# Patient Record
Sex: Female | Born: 1965 | Race: Black or African American | Hispanic: No | Marital: Married | Smoking: Never smoker
Health system: Southern US, Community
[De-identification: ages and names within clinical notes are randomized; demographics above are authoritative.]

## PROBLEM LIST (undated history)

## (undated) DIAGNOSIS — R0602 Shortness of breath: Secondary | ICD-10-CM

## (undated) DIAGNOSIS — I493 Ventricular premature depolarization: Secondary | ICD-10-CM

## (undated) DIAGNOSIS — E538 Deficiency of other specified B group vitamins: Secondary | ICD-10-CM

## (undated) DIAGNOSIS — R1310 Dysphagia, unspecified: Secondary | ICD-10-CM

## (undated) DIAGNOSIS — Z8489 Family history of other specified conditions: Secondary | ICD-10-CM

## (undated) DIAGNOSIS — R002 Palpitations: Secondary | ICD-10-CM

## (undated) DIAGNOSIS — E669 Obesity, unspecified: Secondary | ICD-10-CM

## (undated) DIAGNOSIS — J45909 Unspecified asthma, uncomplicated: Secondary | ICD-10-CM

## (undated) DIAGNOSIS — M7989 Other specified soft tissue disorders: Secondary | ICD-10-CM

## (undated) DIAGNOSIS — E559 Vitamin D deficiency, unspecified: Secondary | ICD-10-CM

## (undated) DIAGNOSIS — R079 Chest pain, unspecified: Secondary | ICD-10-CM

## (undated) DIAGNOSIS — R030 Elevated blood-pressure reading, without diagnosis of hypertension: Secondary | ICD-10-CM

## (undated) DIAGNOSIS — M2550 Pain in unspecified joint: Secondary | ICD-10-CM

## (undated) DIAGNOSIS — R7303 Prediabetes: Secondary | ICD-10-CM

## (undated) HISTORY — DX: Elevated blood-pressure reading, without diagnosis of hypertension: R03.0

## (undated) HISTORY — PX: LAPAROSCOPY: SHX197

## (undated) HISTORY — DX: Dysphagia, unspecified: R13.10

## (undated) HISTORY — PX: TUBAL LIGATION: SHX77

## (undated) HISTORY — DX: Ventricular premature depolarization: I49.3

## (undated) HISTORY — DX: Obesity, unspecified: E66.9

## (undated) HISTORY — DX: Shortness of breath: R06.02

## (undated) HISTORY — DX: Vitamin D deficiency, unspecified: E55.9

## (undated) HISTORY — DX: Unspecified asthma, uncomplicated: J45.909

## (undated) HISTORY — DX: Other specified soft tissue disorders: M79.89

## (undated) HISTORY — DX: Deficiency of other specified B group vitamins: E53.8

## (undated) HISTORY — DX: Chest pain, unspecified: R07.9

## (undated) HISTORY — DX: Palpitations: R00.2

## (undated) HISTORY — DX: Pain in unspecified joint: M25.50

---

## 2013-10-29 ENCOUNTER — Ambulatory Visit: Payer: Self-pay

## 2013-10-29 LAB — URINALYSIS, COMPLETE
BILIRUBIN, UR: NEGATIVE
Bacteria: NEGATIVE
Glucose,UR: NEGATIVE mg/dL (ref 0–75)
Ketone: NEGATIVE
Leukocyte Esterase: NEGATIVE
Nitrite: NEGATIVE
Ph: 6.5 (ref 4.5–8.0)
Specific Gravity: 1.02 (ref 1.003–1.030)

## 2013-10-29 LAB — WET PREP, GENITAL

## 2013-10-29 LAB — PREGNANCY, URINE: Pregnancy Test, Urine: NEGATIVE m[IU]/mL

## 2013-10-29 LAB — GC/CHLAMYDIA PROBE AMP

## 2013-11-09 ENCOUNTER — Ambulatory Visit: Payer: Self-pay

## 2013-11-26 DIAGNOSIS — Z1589 Genetic susceptibility to other disease: Secondary | ICD-10-CM | POA: Insufficient documentation

## 2013-11-26 HISTORY — DX: Genetic susceptibility to other disease: Z15.89

## 2014-01-14 ENCOUNTER — Ambulatory Visit: Payer: Self-pay | Admitting: Family Medicine

## 2014-02-01 DIAGNOSIS — R3129 Other microscopic hematuria: Secondary | ICD-10-CM | POA: Insufficient documentation

## 2014-09-10 ENCOUNTER — Ambulatory Visit: Payer: Self-pay | Admitting: Registered Nurse

## 2014-09-24 ENCOUNTER — Ambulatory Visit: Payer: Self-pay | Admitting: Physician Assistant

## 2015-01-11 ENCOUNTER — Other Ambulatory Visit: Payer: Self-pay | Admitting: Family Medicine

## 2015-01-11 DIAGNOSIS — Z1231 Encounter for screening mammogram for malignant neoplasm of breast: Secondary | ICD-10-CM

## 2015-10-20 ENCOUNTER — Ambulatory Visit: Payer: 59 | Attending: Obstetrics and Gynecology | Admitting: Physical Therapy

## 2015-10-20 DIAGNOSIS — M629 Disorder of muscle, unspecified: Secondary | ICD-10-CM | POA: Insufficient documentation

## 2015-10-20 DIAGNOSIS — M6289 Other specified disorders of muscle: Secondary | ICD-10-CM

## 2015-10-20 DIAGNOSIS — R279 Unspecified lack of coordination: Secondary | ICD-10-CM | POA: Diagnosis present

## 2015-10-20 DIAGNOSIS — N8184 Pelvic muscle wasting: Secondary | ICD-10-CM | POA: Diagnosis not present

## 2015-10-20 NOTE — Therapy (Addendum)
[facility] [address] [place], [place], [address] Phone: [phone]   Fax:  [phone]  Physical Therapy Evaluation  Patient Details  Name: Almyra BraceRamona C Castillo Segoviano MRN: [id number] Date of Birth: 18-Jun-1966 Referring Provider: Tildon HuskyHalfon  Encounter Date: 10/20/2015      PT End of Session - 10/29/15 1840    Visit Number 1   Number of Visits 12   Date for PT Re-Evaluation 01/12/16   PT Start Time 0840   PT Stop Time 1000   PT Time Calculation (min) 80 min   Activity Tolerance Patient tolerated treatment well;No increased pain   Behavior During Therapy [facility] for tasks assessed/performed      No past medical history on file.  No past surgical history on file.  There were no vitals filed for this visit.  Visit Diagnosis:  Pelvic floor dysfunction  Lack of coordination  Fascial defect      Subjective Assessment - 10/29/15 1839    Subjective   1)  decreased perinal sensation for the past 4 years when her menstrual cycle started to change in duration changing every month to every other 2 month. In 2012, pt underwent a falliopian tube procedure. 2)  R LQ  pain: dull intermittent ache, 5-6/10.  Peaks and lasts 5-10 min. No specific pattern with onset. Started the past 1 year. Denied weight loss, bloody stools, night sweats. Does not interfere with ADLs. 3) Pt reports changes in her bowel movements in consistency from Type  5-7 in the past 6 months. Describes stool "ribbonlike".  Pt thinks it may have to do with her change in her eating. Frequency of bowel movements 1-2x daily.    Denied pain in all places in her body.    Pertinent History Hx of 9 vaginal deliveries, 7 live children.  Tearing/ episiotomies. Hx of 3 month bedrest after her 7th pregnancy due to pre-term labor.  For 7 out 9 pregnancies, pt reports she has had her "membrane scraped".              [facility] - 10/29/15 1833    Assessment   Medical Diagnosis pelvic dysfunction   Referring Provider Halfon   Precautions   Precautions None   Restrictions   Weight Bearing Restrictions No   Balance Screen   Has the patient fallen in the past 6 months No   Coordination   Gross Motor Movements are Fluid and Coordinated --  chest breathing   Fine Motor Movements are Fluid and Coordinated --  no pelvic floor movement w/ cue for contraction, adductors   Posture/Postural Control   Posture Comments lumbopelvic instability w/ hip flexion   Bed Mobility   Bed Mobility --  crunch  method                 Pelvic Floor Special Questions - 10/29/15 1835    Pelvic Floor Internal Exam pt consented verbally and had no contraindications   Exam Type Vaginal   Sensation equal bilaterally w/ q tip test   Palpation bladder in more ventral position in hooklying, within introitus with coughing, and in standing     Strength fair squeeze, definite lift  posterior  > ant initially. required pillow and manual Tx   Strength # of reps 10   Strength # of seconds 1                  PT Education - 10/29/15 1839  Education provided Yes   Education Details POC, anatomy, physioogy, goals    Person(s) Educated Patient   Methods Explanation;Demonstration;Tactile cues;Verbal cues;Handout   Comprehension Returned demonstration;Verbalized understanding             PT Long Term Goals - 10/29/15 1845    PT LONG TERM GOAL #1   Title Pt will demo Grade 3/5/5/5 in pelvic floor strength in order to have improved perineal circulation and increased sensation.   Time 12   Period Weeks   Status New   PT LONG TERM GOAL #2   Title Pt will demo more caudal position of bladder without a pillow under hips in order to progress to upright pelvic floor training exercises.   Time 12   Period Weeks   Status New   PT LONG TERM GOAL #3   Title Pt will demo proper body mechanics with lifting and floor to rise in order to minimize further  lowered position of bladder when lifting and playing with her children.   Time 12   Period Weeks   Status New   PT LONG TERM GOAL #4   Title Pt will report Bristol Type 3-5 in order to demonstrate improved her GI and pelvic floor function.    Time 12   Period Weeks   Status New               Plan - 10/29/15 1840    Clinical Impression Statement Pt is a 62 y female who c/o decreased  perineal sensation, R upper abdominal pain, and loose stools which impact her QOL and ADLs. Pt's  clinical presentations include pelvic floor dyscoordination,  abnormal  position of bladder which limited her ability to perform proper pelvic  floor contraction, and poor understanding of posture and body mechanics  to decrease downward forces onto her pelvic floor. External and internal sensory tests were normal, thus, suspect pt's report of decreased perineal sensation is impacted by poor fascial tensions and abnormal position of bladder in supine and standing positions. Pt's personal factors include multple pregnancies with perineal trauma and delivery difficulty  and various gynecological conditions. Plan to assess her R abdominal pain  more in depth at next session. Pt denied nausea, vomiting, blood in stool/urine, sweats, chills, fevers, nor unintentional weight loss.       Pt will benefit from skilled therapeutic intervention in order to improve on the following deficits Decreased activity tolerance;Hypomobility;Decreased strength;Decreased range of motion;Postural dysfunction;Impaired sensation;Pain;Improper body mechanics;Decreased mobility;Decreased coordination;Decreased safety awareness;Decreased endurance;Increased fascial restricitons   Rehab Potential Good   PT Frequency 1x / week   PT Duration 12 weeks   PT Treatment/Interventions ADLs/Self Care Home Management;Aquatic Therapy;Biofeedback;Cryotherapy;Electrical Stimulation;Gait training;Moist Heat;Traction;Stair training;Functional mobility  training;Therapeutic activities;Therapeutic exercise;Balance training;Neuromuscular re-education;Manual techniques;Patient/family education;Scar mobilization;Passive range of motion;Energy conservation;Taping   Consulted and Agree with Plan of Care Patient         Problem List There are no active problems to display for this patient.   Mariane Masters ,PT, DPT, E-RYT  10/29/2015, 6:51 PM  [facility] MAIN [facility] SERVICES [address] [place], [place], [address] Phone: [phone]   Fax:  [phone]  Name: Janika Jedlicka MRN: [id number] Date of Birth: 03-Aug-1965

## 2015-10-20 NOTE — Patient Instructions (Signed)
   You are now ready to begin training the deep core muscles system: diaphragm, transverse abdominis, pelvic floor . These muscles must work together as a team.           The key to these exercises to train the brain to coordinate the timing of these muscles and to have them turn on for long periods of time to hold you upright against gravity (especially important if you are on your feet all day).These muscles are postural muscles and play a role stabilizing your spine and bodyweight. By doing these repetitions slowly and correctly instead of doing crunches, you will achieve a flatter belly without a lower pooch. You are also placing your spine in a more neutral position and breathing properly which in turn, decreases your risk for problems related to your pelvic floor, abdominal, and low back such as pelvic organ prolapse, hernias, diastasis recti (separation of superficial muscles), disk herniations, spinal fractures. These exercises set a solid foundation for you to later progress to resistance/ strength training with therabands and weights and return to other typical fitness exercises with a stronger deeper core.  DO LEVEL 1   With pillow  10 reps  , 3 times a day separately                                                        FUNCTIONAL POSTURES

## 2015-10-27 ENCOUNTER — Ambulatory Visit: Payer: 59 | Admitting: Physical Therapy

## 2015-10-29 NOTE — Addendum Note (Signed)
Addended by: Mariane MastersYEUNG, SHIN-YIING on: 10/29/2015 07:00 PM   Modules accepted: Orders

## 2015-11-03 ENCOUNTER — Ambulatory Visit: Payer: 59 | Attending: Obstetrics and Gynecology | Admitting: Physical Therapy

## 2015-11-03 DIAGNOSIS — R279 Unspecified lack of coordination: Secondary | ICD-10-CM

## 2015-11-03 DIAGNOSIS — N8184 Pelvic muscle wasting: Secondary | ICD-10-CM | POA: Diagnosis not present

## 2015-11-03 DIAGNOSIS — M629 Disorder of muscle, unspecified: Secondary | ICD-10-CM

## 2015-11-03 DIAGNOSIS — M6289 Other specified disorders of muscle: Secondary | ICD-10-CM

## 2015-11-03 NOTE — Patient Instructions (Signed)
PELVIC FLOOR / KEGEL EXERCISES   Pelvic floor/ Kegel exercises are used to strengthen the muscles in the base of your pelvis that are responsible for supporting your pelvic organs and preventing urine/feces leakage. Based on your therapist's recommendations, they can be performed while standing, sitting, or lying down. Imagine pelvic floor area as a diamond with pelvic landmarks: top =pubic bone, bottom tip=tailbone, sides=sitting bones (ischial tuberosities).    Make yourself aware of this muscle group by using these cues while coordinating your breath:  Inhale, feel pelvic floor diamond area lower like hammock towards your feet and ribcage/belly expanding. Pause. Let the exhale naturally and feel your belly sink, abdominal muscles hugging in around you and you may notice the pelvic diamond draws upward towards your head forming a umbrella shape. Give a squeeze during the exhalation like you are stopping the flow of urine. If you are squeezing the buttock muscles, try to give 50% less effort.   Common Errors:  Breath holding: If you are holding your breath, you may be bearing down against your bladder instead of pulling it up. If you belly bulges up while you are squeezing, you are holding your breath. Be sure to breathe gently in and out while exercising. Counting out loud may help you avoid holding your breath.  Accessory muscle use: You should not see or feel other muscle movement when performing pelvic floor exercises. When done properly, no one can tell that you are performing the exercises. Keep the buttocks, belly and inner thighs relaxed.  Overdoing it: Your muscles can fatigue and stop working for you if you over-exercise. You may actually leak more or feel soreness at the lower abdomen or rectum.  YOUR HOME EXERCISE PROGRAM  LONG HOLDS: Position: on back  Inhale and then exhale. Then squeeze the muscle and count aloud for 3 seconds. Rest with three long breaths. (Be sure to let  belly sink in with exhales and not push outward)  Perform 4 repetitions, 3 times/day                       DECREASE DOWNWARD PRESSURE ON  YOUR PELVIC FLOOR, ABDOMINAL, LOW BACK MUSCLES       PRESERVE YOUR PELVIC HEALTH LONG-TERM   ** SQUEEZE pelvic floor BEFORE YOUR SNEEZE, COUGH, LAUGH   ** EXHALE BEFORE YOU RISE AGAINST GRAVITY (lifting, sit to stand, from squat to stand)   ** LOG ROLL OUT OF BED INSTEAD OF CRUNCH/SIT-UP

## 2015-11-03 NOTE — Therapy (Signed)
[facility] [address] [place], [place], [address] Phone: [phone]   Fax:  [phone]  Physical Therapy Treatment  Patient Details  Name: Tara BraceRamona C Castillo Segoviano MRN: [id number] Date of Birth: 01-May-1966 Referring Provider: Tildon HuskyHalfon  Encounter Date: 11/03/2015      PT End of Session - 11/03/15 2341    Visit Number 2   Number of Visits 12   Date for PT Re-Evaluation 01/12/16   PT Start Time 0904   PT Stop Time 1000   PT Time Calculation (min) 56 min   Activity Tolerance Patient tolerated treatment well;No increased pain   Behavior During Therapy [facility] for tasks assessed/performed      No past medical history on file.  No past surgical history on file.  There were no vitals filed for this visit.      Subjective Assessment - 11/03/15 0910    Subjective Pt reported she had changes in her menstrual cycle and noticed cramping below the navel. Pt felt some anxiety with internal pelvic floor assessment from last session.     Pertinent History Hx of 9 vaginal deliveries, 7 children.  Tearing/ episiotomies. Hx of 3 month bedrest after her 7th pregnancy due to pre-term labor.  For 7 out 9 pregnancies, pt reports she has had her "membrane scraped".              [facility] Assessment - 11/03/15 1004    Coordination   Coordination and Movement Description diaphragmatic breathing improved with lower bra and tactile cues to decrease chest breathing                  Pelvic Floor Special Questions - 11/03/15 0959    Pelvic Floor Internal Exam pt consented verbally and had no contraindications   Exam Type Vaginal   Palpation bladder initially ventral position, more caudal with pelvic floor training   Strength fair squeeze, definite lift   Strength # of reps 4   Strength # of seconds 3   Biofeedback proper ROM           [facility] Adult PT Treatment/Exercise - 11/03/15 1002    Self-Care   Self-Care --   education on pelvic floor function, decrease anxiety related to internal assessment. Pt provided consent today verbally and expressed less anxiety following Tx.     Therapeutic Activites    Therapeutic Activities --  education, decrease anxiety related to dysfunctions   Neuro Re-ed    Neuro Re-ed Details  see pt insturctions (pelvic floor contractions)                 PT Education - 11/03/15 2340    Education provided Yes   Education Details HEP, education on pelvic floor anatomy/physiology   Person(s) Educated Patient   Methods Demonstration;Explanation;Tactile cues;Verbal cues;Handout   Comprehension Returned demonstration;Verbalized understanding             PT Long Term Goals - 10/29/15 1845    PT LONG TERM GOAL #1   Title Pt will demo Grade 3/5/5/5 in pelvic floor strength in order to have improved perineal circulation and increased sensation.   Time 12   Period Weeks   Status New   PT LONG TERM GOAL #2   Title Pt will demo more caudal position of bladder without a pillow under hips in order to progress to upright pelvic floor training exercises.   Time 12   Period Weeks   Status New  PT LONG TERM GOAL #3   Title Pt will demo proper body mechanics with lifting and floor to rise in order to minimize further lowered position of bladder when lifting and playing with her children.   Time 12   Period Weeks   Status New   PT LONG TERM GOAL #4   Title Pt will report Bristol Type 3-5 in order to demonstrate improved her GI and pelvic floor function.    Time 12   Period Weeks   Status New               Plan - 11/03/15 2341    Clinical Impression Statement Pt demo'd more normal position of bladder and was able to progress to endurance pelvic floor  training with proper coordination. Pt will continue to benefit skilled PT.   Rehab Potential Good   PT Frequency 1x / week   PT Duration 12 weeks   PT Treatment/Interventions ADLs/Self Care Home  Management;Aquatic Therapy;Biofeedback;Cryotherapy;Electrical Stimulation;Gait training;Moist Heat;Traction;Stair training;Functional mobility training;Therapeutic activities;Therapeutic exercise;Balance training;Neuromuscular re-education;Manual techniques;Patient/family education;Scar mobilization;Passive range of motion;Energy conservation;Taping   Consulted and Agree with Plan of Care Patient      Patient will benefit from skilled therapeutic intervention in order to improve the following deficits and impairments:  Decreased activity tolerance, Hypomobility, Decreased strength, Decreased range of motion, Postural dysfunction, Impaired sensation, Pain, Improper body mechanics, Decreased mobility, Decreased coordination, Decreased safety awareness, Decreased endurance, Increased fascial restricitons  Visit Diagnosis: Pelvic floor dysfunction  Lack of coordination  Fascial defect     Problem List There are no active problems to display for this patient.   Tara Wood ,PT, DPT, E-RYT  11/03/2015, 11:43 PM   [facility] MAIN [facility] SERVICES [address] [place], [place], [address] Phone: [phone]   Fax:  [phone]  Name: Tara Wood MRN: [id number] Date of Birth: 12/17/1965

## 2015-11-14 ENCOUNTER — Ambulatory Visit: Payer: 59 | Admitting: Physical Therapy

## 2015-11-21 ENCOUNTER — Encounter: Payer: 59 | Admitting: Physical Therapy

## 2015-11-24 ENCOUNTER — Ambulatory Visit: Payer: 59 | Admitting: Physical Therapy

## 2015-12-08 ENCOUNTER — Ambulatory Visit: Payer: 59 | Attending: Obstetrics and Gynecology | Admitting: Physical Therapy

## 2015-12-29 ENCOUNTER — Ambulatory Visit: Payer: 59 | Admitting: Physical Therapy

## 2016-01-24 ENCOUNTER — Encounter: Payer: 59 | Admitting: Physical Therapy

## 2016-01-24 ENCOUNTER — Ambulatory Visit: Payer: 59 | Attending: Obstetrics and Gynecology | Admitting: Physical Therapy

## 2016-01-24 DIAGNOSIS — M7989 Other specified soft tissue disorders: Secondary | ICD-10-CM | POA: Diagnosis present

## 2016-01-24 DIAGNOSIS — R279 Unspecified lack of coordination: Secondary | ICD-10-CM | POA: Diagnosis present

## 2016-01-24 NOTE — Therapy (Signed)
[place] MAIN [facility] SERVICES [address] [place], [place], [address] Phone: [phone]   Fax:  [phone]  Physical Therapy Treatment / Progress Note  Patient Details  Name: Tara Wood MRN: [id number] Date of Birth: 10-23-1965 Referring Provider: Newman Nip  Encounter Date: 01/24/2016      PT End of Session - 01/24/16 1019    Visit Number 3   Number of Visits 12   Date for PT Re-Evaluation 04/09/16   PT Start Time 0820   PT Stop Time 0905   PT Time Calculation (min) 45 min   Activity Tolerance Patient tolerated treatment well;No increased pain   Behavior During Therapy [facility] for tasks assessed/performed      No past medical history on file.  No past surgical history on file.  There were no vitals filed for this visit.      Subjective Assessment - 01/24/16 0822    Subjective Pt reported she has been well and able to hold her pelvic floor muscles to the count of 6 and then can move to the count of 7 for 4 reps before fatigue. Pt has achieved 8 sec hold for 2 reps. Pt feels she knows she would be progressing more if she came more often to PT but her schedule has been busy with her kids. Pt has difficulty contracting the pelvic floor muscles while counting . PT inquired whether she was counting aloud but she was counting in her head.     Pertinent History Hx of 9 vaginal deliveries, 7 children.  Tearing/ episiotomies. Hx of 3 month bedrest after her 7th pregnancy due to pre-term labor.  For 7 out 9 pregnancies, pt reports she has had her "membrane scraped".              [facility] PT Assessment - 01/24/16 1018    Observation/Other Assessments   Observations ankles crossed under chair    Coordination   Coordination and Movement Description pelvic floor lengthening achieved with slight contraction in seated position but pt unable to maintain endurance                   Pelvic Floor Special Questions - 01/24/16  0001    Pelvic Floor Internal Exam pt consented verbally and had no contraindications   Exam Type Vaginal   Palpation bladder initially ventral position, more caudal with pelvic floor training   Strength good squeeze, good lift, able to hold agaisnt strong resistance   Strength # of reps 4   Strength # of seconds 6   Biofeedback proper ROM           [facility] Adult PT Treatment/Exercise - 01/24/16 1153    Neuro Re-ed    Neuro Re-ed Details  see pt instructions, education on progression of exercises, counting aloud to avoid breathholding    Manual Therapy   Internal Pelvic Floor faciliation of anterior pelvic floor mm,                 PT Education - 01/24/16 1019    Education provided Yes   Education Details HEP   Person(s) Educated Patient   Methods Explanation;Demonstration;Tactile cues;Verbal cues;Handout   Comprehension Returned demonstration;Verbalized understanding             PT Long Term Goals - 01/24/16 1020    PT LONG TERM GOAL #1   Title Pt will demo Grade 3/5/5/5 in pelvic floor strength in order to have improved perineal circulation and increased  sensation. (6/28: Grade 4/ 6/ 4)    Time 12   Period Weeks   Status Achieved   PT LONG TERM GOAL #2   Title Pt will demo more caudal position of bladder without a pillow under hips in order to progress to upright pelvic floor training exercises.   Time 12   Period Weeks   Status Partially Met   PT LONG TERM GOAL #3   Title Pt will demo proper body mechanics with lifting and floor to rise in order to minimize further lowered position of bladder when lifting and playing with her children.   Time 12   Period Weeks   Status On-going   PT LONG TERM GOAL #4   Title Pt will report Bristol Type 3-5 in order to demonstrate improved her GI and pelvic floor function.    Time 12   Period Weeks   Status On-going               Plan - 01/24/16 1021    Clinical Impression Statement Pt has achieved one goal,  partially met one goal, and progressing towards her remaining two goals across the past 3 visits across the past three months. Pt was not able to come to her weekly appointments due to scheduling conflicts. Pt showed good carry over with pelvic floor training but required cuing to perform proper technique without holding her breath. Pt showed improved strength in her pelvic floor mm after cuing and manual Tx for more circumferential contraction and more cranial position of her bladder.  Pt will continue to benefit skilled PT to continue with pelvic floor training.       Rehab Potential Good   PT Frequency 1x / week   PT Duration 12 weeks   PT Treatment/Interventions ADLs/Self Care Home Management;Aquatic Therapy;Biofeedback;Cryotherapy;Electrical Stimulation;Gait training;Moist Heat;Traction;Stair training;Functional mobility training;Therapeutic activities;Therapeutic exercise;Balance training;Neuromuscular re-education;Manual techniques;Patient/family education;Scar mobilization;Passive range of motion;Energy conservation;Taping   Consulted and Agree with Plan of Care Patient      Patient will benefit from skilled therapeutic intervention in order to improve the following deficits and impairments:  Decreased activity tolerance, Hypomobility, Decreased strength, Decreased range of motion, Postural dysfunction, Impaired sensation, Pain, Improper body mechanics, Decreased mobility, Decreased coordination, Decreased safety awareness, Decreased endurance, Increased fascial restricitons  Visit Diagnosis: Unspecified lack of coordination - Plan: PT plan of care cert/re-cert  Other specified soft tissue disorders - Plan: PT plan of care cert/re-cert     Problem List There are no active problems to display for this patient.   Jerl Mina ,PT, DPT, E-RYT  01/24/2016, 3:36 PM  [place] MAIN [facility] SERVICES [address] [place], [place], [address] Phone:  [phone]   Fax:  [phone]  Name: Tara Wood MRN: [id number] Date of Birth: [birth year]

## 2016-01-24 NOTE — Patient Instructions (Addendum)
Proper sitting , feet under knee  Decrease straining with bowel movements . Use step stool under feet. Don not raise your heels.   PELVIC FLOOR / KEGEL EXERCISES   Pelvic floor/ Kegel exercises are used to strengthen the muscles in the base of your pelvis that are responsible for supporting your pelvic organs and preventing urine/feces leakage. Based on your therapist's recommendations, they can be performed while standing, sitting, or lying down.  Make yourself aware of this muscle group by using these cues:  Imagine you are in a crowded room and you feel the need to pass gas. Your response is to pull up and in at the rectum.  Close the rectum. Pull the muscles up inside your body,feeling your scrotum lifting as well . Feel the pelvic floor muscles lift as if you were walking into a cold lake.  Place your hand on top of your pubic bone. Tighten and draw in the muscles around the anal muscles without squeezing the buttock muscles.  Common Errors:  Breath holding: If you are holding your breath, you may be bearing down against your bladder instead of pulling it up. If you belly bulges up while you are squeezing, you are holding your breath. Be sure to breathe gently in and out while exercising. Counting out loud may help you avoid holding your breath.  Accessory muscle use: You should not see or feel other muscle movement when performing pelvic floor exercises. When done properly, no one can tell that you are performing the exercises. Keep the buttocks, belly and inner thighs relaxed.  Overdoing it: Your muscles can fatigue and stop working for you if you over-exercise. You may actually leak more or feel soreness at the lower abdomen or rectum.  YOUR HOME EXERCISE PROGRAM  LONG HOLDS: Position: on back w/ pillow under hips   Inhale and then exhale. Then squeeze the muscle and count aloud for 6 seconds. Rest with three long breaths. (Be sure to let belly sink in with exhales and not push  outward)  Perform 4 repetitions, 5 times/day  SHORT HOLDS: Position: on back, sitting   Inhale and then exhale. Then squeeze the muscle.  (Be sure to let belly sink in with exhales and not push outward)  Perform 5 repetitions, 5  Times/day  **ALSO SQUEEZE BEFORE YOUR SNEEZE, COUGH, LAUGH to decrease downward pressure   **ALSO EXHALE BEFORE YOU RISE AGAINST GRAVITY (lifting, sit to stand, from squat to stand)      Drinking 1 - 16 fl oz of water to 1 (16 fl oz of decaf coffee) this week  Gradually increase water to bladder irritant to 2 916 fl oz) to 1 (16 fl oz) this week

## 2016-02-15 ENCOUNTER — Ambulatory Visit: Payer: 59 | Attending: Obstetrics and Gynecology | Admitting: Physical Therapy

## 2016-02-15 DIAGNOSIS — M629 Disorder of muscle, unspecified: Secondary | ICD-10-CM

## 2016-02-15 DIAGNOSIS — R279 Unspecified lack of coordination: Secondary | ICD-10-CM

## 2016-02-15 DIAGNOSIS — N8184 Pelvic muscle wasting: Secondary | ICD-10-CM | POA: Diagnosis present

## 2016-02-15 DIAGNOSIS — M7989 Other specified soft tissue disorders: Secondary | ICD-10-CM | POA: Diagnosis present

## 2016-02-15 DIAGNOSIS — M6289 Other specified disorders of muscle: Secondary | ICD-10-CM

## 2016-02-15 NOTE — Patient Instructions (Addendum)
Handout on sweeping and mopping and lifting mechanics   Stand to floor transitions with front lunge and tucked toes (feet hip width apart)  Engage in pelvic floor/ deep core mm on the rise.  Continue with pelvic floor exercises

## 2016-02-27 NOTE — Therapy (Signed)
[place] MAIN [facility] SERVICES [address] [place], [place], [address] Phone: [phone]   Fax:  [phone]  Physical Therapy Treatment  Patient Details  Name: Tara Wood MRN: [id number] Date of Birth: 1966-04-23 Referring Provider: Newman Nip  Encounter Date: 02/15/2016      PT End of Session - 02/16/16 1702    Visit Number 4   Number of Visits 12   Date for PT Re-Evaluation 04/09/16   Activity Tolerance Patient tolerated treatment well;No increased pain   Behavior During Therapy [facility] for tasks assessed/performed      No past medical history on file.  No past surgical history on file.  There were no vitals filed for this visit.      Subjective Assessment - 02/16/16 1702    Subjective Pt reported the pelvic floor exercises are going better with proper technique she learned last time. Pt has not noticed R LQ pain for the past month. Pt has reported changes in stool formation from [facility] Stool Type from 7 to 5-6.  Pt catches herself from straining with bowel movements.     Pertinent History Hx of 9 vaginal deliveries, 7 children.  Tearing/ episiotomies. Hx of 3 month bedrest after her 7th pregnancy due to pre-term labor.  For 7 out 9 pregnancies, pt reports she has had her "membrane scraped".              [facility] PT Assessment - 02/16/16 1702      Functional Tests   Functional tests --  poor mechanics lifting, floor <> stand transitions.                   Pelvic Floor Special Questions - 02/16/16 1702    Pelvic Floor Internal Exam pt consented verbally and had no contraindications   Exam Type Vaginal   Palpation  more caudal with pelvic floor training   Strength good squeeze, good lift, able to hold agaisnt strong resistance   Strength # of reps 4   Strength # of seconds 7   Biofeedback proper ROM           [facility] Adult PT Treatment/Exercise - 02/16/16 1702       Therapeutic Activites    Therapeutic Activities --  see pt instructions     Neuro Re-ed    Neuro Re-ed Details  --                     PT Long Term Goals - 02/16/16 1702      PT LONG TERM GOAL #1   Title Pt will demo Grade 3/5/5/5 in pelvic floor strength in order to have improved perineal circulation and increased sensation. (6/28: Grade 4/ 6/ 4)    Time 12   Period Weeks   Status Achieved     PT LONG TERM GOAL #2   Title Pt will demo more caudal position of bladder without a pillow under hips in order to progress to upright pelvic floor training exercises.   Time 12   Period Weeks   Status Achieved     PT LONG TERM GOAL #3   Title Pt will demo proper body mechanics with lifting and floor to rise in order to minimize further lowered position of bladder when lifting and playing with her children.   Time 12   Period Weeks   Status Achieved     PT LONG TERM GOAL #4   Title Pt will report [place]  Type 3-5 in order to demonstrate improved her GI and pelvic floor function. (7/20: Type 5-6)    Time 12   Period Weeks   Status Partially Met               Plan -02/16/16 1702    Clinical Impression Statement Pt is progressing well with pelvic floor endurance training and progressed to applying deep core with functional positions. Pt showed a more cranial position of her pelvic organ with more complete closure of her pelvic floor mm without a need to elevate her hips on a pillow. Pt is close to achieving her remaining goal before d/c.    Rehab Potential Good   PT Frequency 1x / week   PT Duration 12 weeks   PT Treatment/Interventions ADLs/Self Care Home Management;Aquatic Therapy;Biofeedback;Cryotherapy;Electrical Stimulation;Gait training;Moist Heat;Traction;Stair training;Functional mobility training;Therapeutic activities;Therapeutic exercise;Balance training;Neuromuscular re-education;Manual techniques;Patient/family education;Scar mobilization;Passive range of motion;Energy  conservation;Taping   Consulted and Agree with Plan of Care Patient      Patient will benefit from skilled therapeutic intervention in order to improve the following deficits and impairments:  Decreased activity tolerance, Hypomobility, Decreased strength, Decreased range of motion, Postural dysfunction, Impaired sensation, Pain, Improper body mechanics, Decreased mobility, Decreased coordination, Decreased safety awareness, Decreased endurance, Increased fascial restricitons  Visit Diagnosis: Unspecified lack of coordination  Other specified soft tissue disorders  Pelvic floor dysfunction  Lack of coordination  Fascial defect     Problem List There are no active problems to display for this patient.   Jerl Mina ,PT, DPT, E-RYT  02/27/2016, 5:05 PM  [place] MAIN [facility] SERVICES [address] [place], [place], [address] Phone: [phone]   Fax:  [phone]  Name: Gianah Batt MRN: [id number] Date of Birth: 1965-10-29

## 2016-03-04 ENCOUNTER — Encounter: Payer: Self-pay | Admitting: Physical Therapy

## 2016-03-04 ENCOUNTER — Ambulatory Visit: Payer: 59 | Attending: Obstetrics and Gynecology | Admitting: Physical Therapy

## 2016-03-04 NOTE — Therapy (Signed)
[facility] [address] [place], [place], [address] Phone: [phone]   Fax:  [phone]  Patient Details  Name: Tara Wood MRN: [id number] Date of Birth: [birth year] Referring Provider:  Dr. Tildon HuskyHalfon   Encounter Date: 03/04/2016    Discharge Summary  Across 4 visits, pt has achieved 100% of her goals.  Pt's pelvic floor endurance has improved along with her bowel movements which will help her to minimize the risk for pelvic organ prolapse and other pelvic floor dysfunctions.  She now demonstrates a more cranial position of her pelvic organ with more complete closure of her pelvic floor mm and improved coordination of her deep core mm. Pt reports she has [facility] "[facility]eratley better" [facility] the Global Rate of Change.  Thank you for your referral!        PT Long Term Goals - 02/27/16 1659      PT LONG TERM GOAL #1   Title Pt will demo Grade 3/5/5/5 in pelvic floor strength in order to have improved perineal circulation and increased sensation. (6/28: Grade 4/ 6/ 4)    Time 12   Period Weeks   Status Achieved     PT LONG TERM GOAL #2   Title Pt will demo more caudal position of bladder without a pillow under hips in order to progress to upright pelvic floor training exercises.   Time 12   Period Weeks   Status Achieved     PT LONG TERM GOAL #3   Title Pt will demo proper body mechanics with lifting and floor to rise in order to minimize further lowered position of bladder when lifting and playing with her children.   Time 12   Period Weeks   Status Achieved     PT LONG TERM GOAL #4   Title Pt will report [place] Type 3-5 in order to demonstrate improved her GI and pelvic floor function. (7/20: Type 5-6)    Time 12   Period Weeks   Status Achieved           Mariane MastersYeung,Shin Yiing ,PT, DPT, E-RYT  03/04/2016, 9:59 AM  [place] [facility] [address] [place], [place], [address] Phone: [phone]   Fax:  [phone]

## 2016-07-05 DIAGNOSIS — Z1231 Encounter for screening mammogram for malignant neoplasm of breast: Secondary | ICD-10-CM | POA: Diagnosis not present

## 2016-07-11 DIAGNOSIS — R0600 Dyspnea, unspecified: Secondary | ICD-10-CM | POA: Diagnosis not present

## 2016-07-11 DIAGNOSIS — R42 Dizziness and giddiness: Secondary | ICD-10-CM | POA: Diagnosis not present

## 2016-07-11 DIAGNOSIS — I10 Essential (primary) hypertension: Secondary | ICD-10-CM | POA: Diagnosis not present

## 2016-07-26 DIAGNOSIS — J4520 Mild intermittent asthma, uncomplicated: Secondary | ICD-10-CM | POA: Diagnosis not present

## 2016-07-26 DIAGNOSIS — R3129 Other microscopic hematuria: Secondary | ICD-10-CM | POA: Diagnosis not present

## 2016-07-26 DIAGNOSIS — M545 Low back pain: Secondary | ICD-10-CM | POA: Diagnosis not present

## 2016-07-26 DIAGNOSIS — J309 Allergic rhinitis, unspecified: Secondary | ICD-10-CM | POA: Diagnosis not present

## 2016-09-09 DIAGNOSIS — Z23 Encounter for immunization: Secondary | ICD-10-CM | POA: Diagnosis not present

## 2016-09-09 DIAGNOSIS — J4520 Mild intermittent asthma, uncomplicated: Secondary | ICD-10-CM | POA: Diagnosis not present

## 2016-09-09 DIAGNOSIS — R040 Epistaxis: Secondary | ICD-10-CM | POA: Diagnosis not present

## 2016-09-09 DIAGNOSIS — G5601 Carpal tunnel syndrome, right upper limb: Secondary | ICD-10-CM | POA: Diagnosis not present

## 2016-10-18 DIAGNOSIS — J301 Allergic rhinitis due to pollen: Secondary | ICD-10-CM | POA: Diagnosis not present

## 2016-11-18 DIAGNOSIS — G5603 Carpal tunnel syndrome, bilateral upper limbs: Secondary | ICD-10-CM | POA: Diagnosis not present

## 2016-11-18 DIAGNOSIS — R200 Anesthesia of skin: Secondary | ICD-10-CM | POA: Diagnosis not present

## 2016-12-12 DIAGNOSIS — M162 Bilateral osteoarthritis resulting from hip dysplasia: Secondary | ICD-10-CM | POA: Diagnosis not present

## 2016-12-12 DIAGNOSIS — M25552 Pain in left hip: Secondary | ICD-10-CM | POA: Diagnosis not present

## 2016-12-12 DIAGNOSIS — M25551 Pain in right hip: Secondary | ICD-10-CM | POA: Diagnosis not present

## 2016-12-12 DIAGNOSIS — G5603 Carpal tunnel syndrome, bilateral upper limbs: Secondary | ICD-10-CM | POA: Diagnosis not present

## 2016-12-12 DIAGNOSIS — R209 Unspecified disturbances of skin sensation: Secondary | ICD-10-CM | POA: Diagnosis not present

## 2016-12-18 DIAGNOSIS — G5603 Carpal tunnel syndrome, bilateral upper limbs: Secondary | ICD-10-CM | POA: Diagnosis not present

## 2016-12-26 DIAGNOSIS — J4520 Mild intermittent asthma, uncomplicated: Secondary | ICD-10-CM | POA: Insufficient documentation

## 2016-12-26 DIAGNOSIS — J069 Acute upper respiratory infection, unspecified: Secondary | ICD-10-CM | POA: Diagnosis not present

## 2016-12-26 DIAGNOSIS — J309 Allergic rhinitis, unspecified: Secondary | ICD-10-CM | POA: Diagnosis not present

## 2016-12-30 DIAGNOSIS — R3129 Other microscopic hematuria: Secondary | ICD-10-CM | POA: Diagnosis not present

## 2016-12-31 DIAGNOSIS — M25552 Pain in left hip: Secondary | ICD-10-CM | POA: Diagnosis not present

## 2016-12-31 DIAGNOSIS — R2689 Other abnormalities of gait and mobility: Secondary | ICD-10-CM | POA: Diagnosis not present

## 2016-12-31 DIAGNOSIS — M25551 Pain in right hip: Secondary | ICD-10-CM | POA: Diagnosis not present

## 2016-12-31 DIAGNOSIS — M79674 Pain in right toe(s): Secondary | ICD-10-CM | POA: Diagnosis not present

## 2016-12-31 DIAGNOSIS — M6281 Muscle weakness (generalized): Secondary | ICD-10-CM | POA: Diagnosis not present

## 2017-01-10 DIAGNOSIS — B349 Viral infection, unspecified: Secondary | ICD-10-CM | POA: Diagnosis not present

## 2017-01-10 DIAGNOSIS — R0789 Other chest pain: Secondary | ICD-10-CM | POA: Diagnosis not present

## 2017-01-10 DIAGNOSIS — R51 Headache: Secondary | ICD-10-CM | POA: Diagnosis not present

## 2017-01-10 DIAGNOSIS — Z79899 Other long term (current) drug therapy: Secondary | ICD-10-CM | POA: Diagnosis not present

## 2017-01-10 DIAGNOSIS — R42 Dizziness and giddiness: Secondary | ICD-10-CM | POA: Diagnosis not present

## 2017-01-10 DIAGNOSIS — R5383 Other fatigue: Secondary | ICD-10-CM | POA: Diagnosis not present

## 2017-01-10 DIAGNOSIS — I10 Essential (primary) hypertension: Secondary | ICD-10-CM | POA: Diagnosis not present

## 2017-01-10 DIAGNOSIS — I493 Ventricular premature depolarization: Secondary | ICD-10-CM | POA: Diagnosis not present

## 2017-01-10 DIAGNOSIS — R001 Bradycardia, unspecified: Secondary | ICD-10-CM | POA: Diagnosis not present

## 2017-01-10 DIAGNOSIS — R0602 Shortness of breath: Secondary | ICD-10-CM | POA: Diagnosis not present

## 2017-01-10 DIAGNOSIS — J45909 Unspecified asthma, uncomplicated: Secondary | ICD-10-CM | POA: Diagnosis not present

## 2017-01-10 DIAGNOSIS — R079 Chest pain, unspecified: Secondary | ICD-10-CM | POA: Diagnosis not present

## 2017-01-10 DIAGNOSIS — R008 Other abnormalities of heart beat: Secondary | ICD-10-CM | POA: Diagnosis not present

## 2017-01-11 DIAGNOSIS — R079 Chest pain, unspecified: Secondary | ICD-10-CM | POA: Diagnosis not present

## 2017-01-11 DIAGNOSIS — I493 Ventricular premature depolarization: Secondary | ICD-10-CM | POA: Diagnosis not present

## 2017-01-11 DIAGNOSIS — R9431 Abnormal electrocardiogram [ECG] [EKG]: Secondary | ICD-10-CM | POA: Diagnosis not present

## 2017-01-16 DIAGNOSIS — J309 Allergic rhinitis, unspecified: Secondary | ICD-10-CM | POA: Diagnosis not present

## 2017-01-16 DIAGNOSIS — R42 Dizziness and giddiness: Secondary | ICD-10-CM | POA: Diagnosis not present

## 2017-01-16 DIAGNOSIS — I10 Essential (primary) hypertension: Secondary | ICD-10-CM | POA: Diagnosis not present

## 2017-01-16 DIAGNOSIS — R008 Other abnormalities of heart beat: Secondary | ICD-10-CM | POA: Diagnosis not present

## 2017-01-21 DIAGNOSIS — M25551 Pain in right hip: Secondary | ICD-10-CM | POA: Diagnosis not present

## 2017-01-21 DIAGNOSIS — M6281 Muscle weakness (generalized): Secondary | ICD-10-CM | POA: Diagnosis not present

## 2017-01-21 DIAGNOSIS — M79674 Pain in right toe(s): Secondary | ICD-10-CM | POA: Diagnosis not present

## 2017-01-21 DIAGNOSIS — M25552 Pain in left hip: Secondary | ICD-10-CM | POA: Diagnosis not present

## 2017-01-21 DIAGNOSIS — R2689 Other abnormalities of gait and mobility: Secondary | ICD-10-CM | POA: Diagnosis not present

## 2017-02-05 DIAGNOSIS — I493 Ventricular premature depolarization: Secondary | ICD-10-CM | POA: Diagnosis not present

## 2017-02-05 DIAGNOSIS — I10 Essential (primary) hypertension: Secondary | ICD-10-CM | POA: Insufficient documentation

## 2017-02-10 DIAGNOSIS — I493 Ventricular premature depolarization: Secondary | ICD-10-CM | POA: Diagnosis not present

## 2017-02-12 DIAGNOSIS — R079 Chest pain, unspecified: Secondary | ICD-10-CM | POA: Diagnosis not present

## 2017-02-12 DIAGNOSIS — I493 Ventricular premature depolarization: Secondary | ICD-10-CM | POA: Diagnosis not present

## 2017-02-26 DIAGNOSIS — I493 Ventricular premature depolarization: Secondary | ICD-10-CM | POA: Diagnosis not present

## 2017-02-26 DIAGNOSIS — I10 Essential (primary) hypertension: Secondary | ICD-10-CM | POA: Diagnosis not present

## 2017-02-27 DIAGNOSIS — J4520 Mild intermittent asthma, uncomplicated: Secondary | ICD-10-CM | POA: Diagnosis not present

## 2017-02-27 DIAGNOSIS — Z23 Encounter for immunization: Secondary | ICD-10-CM | POA: Diagnosis not present

## 2017-02-27 DIAGNOSIS — I493 Ventricular premature depolarization: Secondary | ICD-10-CM | POA: Diagnosis not present

## 2017-02-27 DIAGNOSIS — Z1211 Encounter for screening for malignant neoplasm of colon: Secondary | ICD-10-CM | POA: Diagnosis not present

## 2017-02-27 DIAGNOSIS — R42 Dizziness and giddiness: Secondary | ICD-10-CM | POA: Diagnosis not present

## 2017-07-17 DIAGNOSIS — R350 Frequency of micturition: Secondary | ICD-10-CM | POA: Diagnosis not present

## 2017-07-17 DIAGNOSIS — J3089 Other allergic rhinitis: Secondary | ICD-10-CM | POA: Diagnosis not present

## 2017-07-17 DIAGNOSIS — I10 Essential (primary) hypertension: Secondary | ICD-10-CM | POA: Diagnosis not present

## 2017-07-17 DIAGNOSIS — Z23 Encounter for immunization: Secondary | ICD-10-CM | POA: Diagnosis not present

## 2017-07-17 DIAGNOSIS — L309 Dermatitis, unspecified: Secondary | ICD-10-CM | POA: Diagnosis not present

## 2017-07-17 DIAGNOSIS — J4520 Mild intermittent asthma, uncomplicated: Secondary | ICD-10-CM | POA: Diagnosis not present

## 2017-07-30 DIAGNOSIS — Z1211 Encounter for screening for malignant neoplasm of colon: Secondary | ICD-10-CM | POA: Diagnosis not present

## 2017-09-04 DIAGNOSIS — J309 Allergic rhinitis, unspecified: Secondary | ICD-10-CM | POA: Diagnosis not present

## 2017-09-04 DIAGNOSIS — R300 Dysuria: Secondary | ICD-10-CM | POA: Diagnosis not present

## 2017-09-04 DIAGNOSIS — Z0000 Encounter for general adult medical examination without abnormal findings: Secondary | ICD-10-CM | POA: Diagnosis not present

## 2017-09-04 DIAGNOSIS — J4520 Mild intermittent asthma, uncomplicated: Secondary | ICD-10-CM | POA: Diagnosis not present

## 2017-09-05 DIAGNOSIS — I10 Essential (primary) hypertension: Secondary | ICD-10-CM | POA: Diagnosis not present

## 2017-09-05 DIAGNOSIS — I493 Ventricular premature depolarization: Secondary | ICD-10-CM | POA: Diagnosis not present

## 2017-11-14 DIAGNOSIS — N898 Other specified noninflammatory disorders of vagina: Secondary | ICD-10-CM | POA: Diagnosis not present

## 2017-11-14 DIAGNOSIS — R399 Unspecified symptoms and signs involving the genitourinary system: Secondary | ICD-10-CM | POA: Diagnosis not present

## 2017-11-14 DIAGNOSIS — M79671 Pain in right foot: Secondary | ICD-10-CM | POA: Diagnosis not present

## 2017-11-17 DIAGNOSIS — M79604 Pain in right leg: Secondary | ICD-10-CM | POA: Diagnosis not present

## 2017-11-17 DIAGNOSIS — M79671 Pain in right foot: Secondary | ICD-10-CM | POA: Diagnosis not present

## 2017-11-17 DIAGNOSIS — M7989 Other specified soft tissue disorders: Secondary | ICD-10-CM | POA: Diagnosis not present

## 2017-11-21 DIAGNOSIS — M7989 Other specified soft tissue disorders: Secondary | ICD-10-CM | POA: Diagnosis not present

## 2017-11-21 DIAGNOSIS — M79671 Pain in right foot: Secondary | ICD-10-CM | POA: Diagnosis not present

## 2017-11-27 DIAGNOSIS — Z1231 Encounter for screening mammogram for malignant neoplasm of breast: Secondary | ICD-10-CM | POA: Diagnosis not present

## 2017-11-27 DIAGNOSIS — M7989 Other specified soft tissue disorders: Secondary | ICD-10-CM | POA: Diagnosis not present

## 2017-11-27 DIAGNOSIS — M79671 Pain in right foot: Secondary | ICD-10-CM | POA: Diagnosis not present

## 2017-11-27 DIAGNOSIS — M79604 Pain in right leg: Secondary | ICD-10-CM | POA: Diagnosis not present

## 2017-11-28 DIAGNOSIS — R600 Localized edema: Secondary | ICD-10-CM | POA: Diagnosis not present

## 2017-12-09 DIAGNOSIS — M79671 Pain in right foot: Secondary | ICD-10-CM | POA: Diagnosis not present

## 2017-12-09 DIAGNOSIS — M7989 Other specified soft tissue disorders: Secondary | ICD-10-CM | POA: Diagnosis not present

## 2017-12-11 DIAGNOSIS — M79671 Pain in right foot: Secondary | ICD-10-CM | POA: Insufficient documentation

## 2017-12-15 DIAGNOSIS — M79671 Pain in right foot: Secondary | ICD-10-CM | POA: Diagnosis not present

## 2017-12-29 DIAGNOSIS — M79671 Pain in right foot: Secondary | ICD-10-CM | POA: Diagnosis not present

## 2017-12-29 DIAGNOSIS — R600 Localized edema: Secondary | ICD-10-CM | POA: Diagnosis not present

## 2017-12-30 ENCOUNTER — Other Ambulatory Visit: Payer: Self-pay | Admitting: Podiatry

## 2017-12-30 DIAGNOSIS — R600 Localized edema: Secondary | ICD-10-CM

## 2018-01-09 ENCOUNTER — Encounter (INDEPENDENT_AMBULATORY_CARE_PROVIDER_SITE_OTHER): Payer: Self-pay

## 2018-01-09 ENCOUNTER — Ambulatory Visit
Admission: RE | Admit: 2018-01-09 | Discharge: 2018-01-09 | Disposition: A | Discharge status: Home / Self Care | Payer: 59 | Source: Ambulatory Visit | Attending: Podiatry | Admitting: Podiatry

## 2018-01-09 DIAGNOSIS — M65871 Other synovitis and tenosynovitis, right ankle and foot: Secondary | ICD-10-CM | POA: Diagnosis not present

## 2018-01-09 DIAGNOSIS — R600 Localized edema: Secondary | ICD-10-CM

## 2018-01-30 DIAGNOSIS — M79671 Pain in right foot: Secondary | ICD-10-CM | POA: Diagnosis not present

## 2018-01-30 DIAGNOSIS — R768 Other specified abnormal immunological findings in serum: Secondary | ICD-10-CM | POA: Diagnosis not present

## 2018-02-02 DIAGNOSIS — R202 Paresthesia of skin: Secondary | ICD-10-CM | POA: Diagnosis not present

## 2018-02-16 DIAGNOSIS — R8290 Unspecified abnormal findings in urine: Secondary | ICD-10-CM | POA: Diagnosis not present

## 2018-02-16 DIAGNOSIS — R319 Hematuria, unspecified: Secondary | ICD-10-CM | POA: Diagnosis not present

## 2018-03-24 DIAGNOSIS — G44209 Tension-type headache, unspecified, not intractable: Secondary | ICD-10-CM | POA: Diagnosis not present

## 2018-03-24 DIAGNOSIS — R300 Dysuria: Secondary | ICD-10-CM | POA: Diagnosis not present

## 2018-03-24 DIAGNOSIS — M545 Low back pain: Secondary | ICD-10-CM | POA: Diagnosis not present

## 2018-05-22 DIAGNOSIS — M546 Pain in thoracic spine: Secondary | ICD-10-CM | POA: Diagnosis not present

## 2018-05-22 DIAGNOSIS — M62838 Other muscle spasm: Secondary | ICD-10-CM | POA: Diagnosis not present

## 2018-07-03 DIAGNOSIS — N644 Mastodynia: Secondary | ICD-10-CM | POA: Diagnosis not present

## 2018-07-15 DIAGNOSIS — R202 Paresthesia of skin: Secondary | ICD-10-CM | POA: Diagnosis not present

## 2018-08-05 ENCOUNTER — Other Ambulatory Visit: Payer: Self-pay | Admitting: Medical Oncology

## 2018-08-05 DIAGNOSIS — N644 Mastodynia: Secondary | ICD-10-CM

## 2018-08-12 DIAGNOSIS — R29898 Other symptoms and signs involving the musculoskeletal system: Secondary | ICD-10-CM | POA: Diagnosis not present

## 2018-08-19 ENCOUNTER — Ambulatory Visit
Admission: RE | Admit: 2018-08-19 | Discharge: 2018-08-19 | Disposition: A | Discharge status: Home / Self Care | Payer: 59 | Source: Ambulatory Visit | Attending: Medical Oncology | Admitting: Medical Oncology

## 2018-08-19 DIAGNOSIS — N644 Mastodynia: Secondary | ICD-10-CM | POA: Insufficient documentation

## 2018-08-19 DIAGNOSIS — R928 Other abnormal and inconclusive findings on diagnostic imaging of breast: Secondary | ICD-10-CM | POA: Diagnosis not present

## 2018-09-16 DIAGNOSIS — Z23 Encounter for immunization: Secondary | ICD-10-CM | POA: Diagnosis not present

## 2018-09-16 DIAGNOSIS — M67431 Ganglion, right wrist: Secondary | ICD-10-CM | POA: Diagnosis not present

## 2018-09-16 DIAGNOSIS — J4531 Mild persistent asthma with (acute) exacerbation: Secondary | ICD-10-CM | POA: Diagnosis not present

## 2018-09-16 DIAGNOSIS — I10 Essential (primary) hypertension: Secondary | ICD-10-CM | POA: Diagnosis not present

## 2018-12-15 DIAGNOSIS — M25562 Pain in left knee: Secondary | ICD-10-CM | POA: Diagnosis not present

## 2018-12-15 DIAGNOSIS — R300 Dysuria: Secondary | ICD-10-CM | POA: Diagnosis not present

## 2018-12-24 DIAGNOSIS — R5383 Other fatigue: Secondary | ICD-10-CM | POA: Diagnosis not present

## 2018-12-24 DIAGNOSIS — R52 Pain, unspecified: Secondary | ICD-10-CM | POA: Diagnosis not present

## 2019-01-11 DIAGNOSIS — M79671 Pain in right foot: Secondary | ICD-10-CM | POA: Diagnosis not present

## 2019-01-11 DIAGNOSIS — R768 Other specified abnormal immunological findings in serum: Secondary | ICD-10-CM | POA: Diagnosis not present

## 2019-01-11 DIAGNOSIS — E669 Obesity, unspecified: Secondary | ICD-10-CM | POA: Diagnosis not present

## 2019-01-11 DIAGNOSIS — M2550 Pain in unspecified joint: Secondary | ICD-10-CM | POA: Insufficient documentation

## 2019-02-18 DIAGNOSIS — N898 Other specified noninflammatory disorders of vagina: Secondary | ICD-10-CM | POA: Diagnosis not present

## 2019-02-18 DIAGNOSIS — R8290 Unspecified abnormal findings in urine: Secondary | ICD-10-CM | POA: Diagnosis not present

## 2019-02-18 DIAGNOSIS — M79604 Pain in right leg: Secondary | ICD-10-CM | POA: Diagnosis not present

## 2019-02-18 DIAGNOSIS — R300 Dysuria: Secondary | ICD-10-CM | POA: Diagnosis not present

## 2019-02-18 DIAGNOSIS — R52 Pain, unspecified: Secondary | ICD-10-CM | POA: Diagnosis not present

## 2019-02-19 ENCOUNTER — Other Ambulatory Visit: Payer: Self-pay

## 2019-02-19 DIAGNOSIS — R6889 Other general symptoms and signs: Secondary | ICD-10-CM | POA: Diagnosis not present

## 2019-02-19 DIAGNOSIS — Z20822 Contact with and (suspected) exposure to covid-19: Secondary | ICD-10-CM

## 2019-02-22 ENCOUNTER — Other Ambulatory Visit: Payer: Self-pay

## 2019-02-22 ENCOUNTER — Ambulatory Visit
Admission: EM | Admit: 2019-02-22 | Discharge: 2019-02-22 | Disposition: A | Discharge status: Home / Self Care | Payer: 59 | Attending: Emergency Medicine | Admitting: Emergency Medicine

## 2019-02-22 DIAGNOSIS — U071 COVID-19: Secondary | ICD-10-CM

## 2019-02-22 DIAGNOSIS — J40 Bronchitis, not specified as acute or chronic: Secondary | ICD-10-CM | POA: Diagnosis not present

## 2019-02-22 LAB — NOVEL CORONAVIRUS, NAA: SARS-CoV-2, NAA: DETECTED — AB

## 2019-02-22 MED ORDER — PREDNISONE 50 MG PO TABS: 50.0000 mg | ORAL_TABLET | Freq: Every day | ORAL | 0 refills | Status: DC

## 2019-02-22 MED ORDER — AZITHROMYCIN 250 MG PO TABS: ORAL_TABLET | ORAL | 0 refills | Status: DC

## 2019-02-22 NOTE — ED Provider Notes (Signed)
[facility]    CSN: [id number] Arrival date & time: 02/22/19  1658      History   Chief Complaint Chief Complaint  Patient presents with  . COVID Positive  . Shortness of Breath    HPI Tara Wood is a 53 y.o. female presenting with increased shortness of breath and right sided chest pain. Pt is COVID-19 positive and has been isolating at home since diagnosis. Pt states she has pain to her right posterior chest when she take a deep breaths. No fevers in 48 hrs.   History reviewed. No pertinent past medical history.  There are no active problems to display for this patient.   Past Surgical History:  Procedure Laterality Date  . TUBAL LIGATION      OB History   No obstetric history on file.      Home Medications    Prior to Admission medications   Medication Sig Start Date End Date Taking? Authorizing Provider  ergocalciferol (VITAMIN D2) 50000 units capsule Take 50,000 Units by mouth once a week.   Yes [provider]  levalbuterol Pauline Aus(XOPENEX HFA) 45 MCG/ACT inhaler Inhale into the lungs.   Yes [provider]  loratadine (CLARITIN) 10 MG tablet Take by mouth.   Yes [provider]  spironolactone (ALDACTONE) 25 MG tablet  01/15/19  Yes [provider]  amoxicillin-clavulanate (AUGMENTIN) 875-125 MG tablet Take 1 tablet by mouth 2 (two) times daily.    [provider]  azithromycin (ZITHROMAX Z-PAK) 250 MG tablet Take as directed on package. 02/22/19   Bailey MechBenjamin, Janissa Bertram, NP  hydrochlorothiazide (MICROZIDE) 12.5 MG capsule Take 12.5 mg by mouth daily.    [provider]  levocetirizine (XYZAL) 5 MG tablet Take 5 mg by mouth every evening.    [provider]  losartan (COZAAR) 100 MG tablet Take 100 mg by mouth daily. Reported on 10/20/2015    [provider]  losartan (COZAAR) 25 MG tablet Take 25 mg by mouth daily.    [provider]  mometasone (ELOCON) 0.1 % cream  Apply 1 application topically daily.    [provider]  mometasone (NASONEX) 50 MCG/ACT nasal spray Place 2 sprays into the nose daily.    [provider]  montelukast (SINGULAIR) 10 MG tablet Take 10 mg by mouth at bedtime.    [provider]  potassium chloride (K-DUR,KLOR-CON) 10 MEQ tablet Take 10 mEq by mouth 2 (two) times daily.    [provider]  predniSONE (DELTASONE) 50 MG tablet Take 1 tablet (50 mg total) by mouth daily with breakfast. 02/22/19   Bailey MechBenjamin, Angelli Baruch, NP    Family History Family History  Problem Relation Age of Onset  . Heart failure Mother   . Parkinson's disease Mother   . Stroke Father     Social History Social History   Tobacco Use  . Smoking status: Never Smoker  . Smokeless tobacco: Never Used  Substance Use Topics  . Alcohol use: Not Currently  . Drug use: Not Currently     Allergies   Patient has no known allergies.   Review of Systems Review of Systems  Constitutional: Positive for fatigue. Negative for appetite change, chills and fever.  Respiratory: Positive for cough, chest tightness and shortness of breath.   Gastrointestinal: Negative for nausea and vomiting.     Physical Exam Triage Vital Signs ED Triage Vitals  Enc Vitals Group     BP 02/22/19 1725 (!) 139/93  Pulse Rate 02/22/19 1725 90     Resp 02/22/19 1725 18     Temp 02/22/19 1725 98.6 F (37 C)     Temp Source 02/22/19 1725 Oral     SpO2 02/22/19 1725 99 %     Weight 02/22/19 1720 230 lb (104.3 kg)     Height 02/22/19 1720 5\' 7"  (1.702 m)     Head Circumference --      Peak Flow --      Pain Score 02/22/19 1719 4     Pain Loc --      Pain Edu? --      Excl. in GC? --    No data found.  Updated Vital Signs BP (!) 139/93 (BP Location: Left Arm)   Pulse 90   Temp 98.6 F (37 C) (Oral)   Resp 18   Ht 5\' 7"  (1.702 m)   Wt 230 lb (104.3 kg)   SpO2 99%   BMI 36.02 kg/m   Visual Acuity Right Eye Distance:   Left Eye  Distance:   Bilateral Distance:    Right Eye Near:   Left Eye Near:    Bilateral Near:     Physical Exam Vitals signs and nursing note reviewed.  Constitutional:      General: She is not in acute distress.    Appearance: She is well-developed.  HENT:     Head: Normocephalic and atraumatic.  Eyes:     Conjunctiva/sclera: Conjunctivae normal.  Neck:     Musculoskeletal: Neck supple.  Cardiovascular:     Rate and Rhythm: Normal rate and regular rhythm.     Heart sounds: No murmur.  Pulmonary:     Effort: Pulmonary effort is normal. No respiratory distress.     Breath sounds: Normal breath sounds.     Comments: Limited ausculation due to use of disposable stethoscope  Abdominal:     Palpations: Abdomen is soft.     Tenderness: There is no abdominal tenderness.  Skin:    General: Skin is warm and dry.  Neurological:     Mental Status: She is alert.      UC Treatments / Results  Labs (all labs ordered are listed, but only abnormal results are displayed) Labs Reviewed - No data to display  EKG   Radiology No results found.   Deferred to limit COVID-19 exposure.  Procedures Procedures (including critical care time)  Medications Ordered in UC Medications - No data to display  Initial Impression / Assessment and Plan / UC Course  I have reviewed the triage vital signs and the nursing notes.  Pertinent labs & imaging results that were available during my care of the patient were reviewed by me and considered in my medical decision making (see chart for details).     Pt presents with shortness of breath with known COVID-19, diagnosed with bronchitis due to COVID-19 and treated as such with the prescribed medications below. Strict isolation and ED criteria reviewed; pt verbalized agreement. All questions answered and all concerns addressed.   Final Clinical Impressions(s) / UC Diagnoses   Final diagnoses:  Bronchitis due to COVID-19 virus   Discharge  Instructions   None    ED Prescriptions    Medication Sig Dispense Auth. Provider   predniSONE (DELTASONE) 50 MG tablet Take 1 tablet (50 mg total) by mouth daily with breakfast. 5 tablet Bailey MechBenjamin, Ryver Zadrozny, NP   azithromycin (ZITHROMAX Z-PAK) 250 MG tablet Take as directed on package. 1 each Bailey MechBenjamin, Cordae Mccarey,  NP     Controlled Substance Prescriptions [place] Controlled Substance Registry consulted? Not Applicable    Gertie Baron, DNP, NP-c    Gertie Baron, NP 02/22/19 Bosie Helper

## 2019-02-22 NOTE — ED Triage Notes (Signed)
Patient complains of shortness of breath that has worsened. Patient states that she is concerned that she may have pneumonia with covid. Patient Covid test was positive 3 days ago.

## 2019-03-23 DIAGNOSIS — R29898 Other symptoms and signs involving the musculoskeletal system: Secondary | ICD-10-CM | POA: Diagnosis not present

## 2019-03-23 DIAGNOSIS — M79604 Pain in right leg: Secondary | ICD-10-CM | POA: Diagnosis not present

## 2019-03-23 DIAGNOSIS — R200 Anesthesia of skin: Secondary | ICD-10-CM | POA: Diagnosis not present

## 2019-03-23 DIAGNOSIS — G959 Disease of spinal cord, unspecified: Secondary | ICD-10-CM | POA: Diagnosis not present

## 2019-03-24 ENCOUNTER — Other Ambulatory Visit (HOSPITAL_COMMUNITY): Payer: Self-pay | Admitting: Student

## 2019-03-24 ENCOUNTER — Other Ambulatory Visit: Payer: Self-pay | Admitting: Student

## 2019-03-24 DIAGNOSIS — M79604 Pain in right leg: Secondary | ICD-10-CM

## 2019-03-24 DIAGNOSIS — R29898 Other symptoms and signs involving the musculoskeletal system: Secondary | ICD-10-CM

## 2019-03-24 DIAGNOSIS — M4802 Spinal stenosis, cervical region: Secondary | ICD-10-CM

## 2019-03-24 DIAGNOSIS — R200 Anesthesia of skin: Secondary | ICD-10-CM

## 2019-04-04 ENCOUNTER — Ambulatory Visit: Payer: 59

## 2019-04-13 DIAGNOSIS — R202 Paresthesia of skin: Secondary | ICD-10-CM | POA: Insufficient documentation

## 2019-04-13 DIAGNOSIS — M79609 Pain in unspecified limb: Secondary | ICD-10-CM | POA: Insufficient documentation

## 2019-04-13 DIAGNOSIS — M7918 Myalgia, other site: Secondary | ICD-10-CM | POA: Diagnosis not present

## 2019-04-13 DIAGNOSIS — R768 Other specified abnormal immunological findings in serum: Secondary | ICD-10-CM | POA: Diagnosis not present

## 2019-05-31 DIAGNOSIS — I493 Ventricular premature depolarization: Secondary | ICD-10-CM | POA: Diagnosis not present

## 2019-06-01 DIAGNOSIS — I493 Ventricular premature depolarization: Secondary | ICD-10-CM | POA: Diagnosis not present

## 2019-06-01 DIAGNOSIS — I10 Essential (primary) hypertension: Secondary | ICD-10-CM | POA: Diagnosis not present

## 2019-06-02 ENCOUNTER — Ambulatory Visit (INDEPENDENT_AMBULATORY_CARE_PROVIDER_SITE_OTHER): Payer: Self-pay | Admitting: Family Medicine

## 2019-06-07 DIAGNOSIS — R29898 Other symptoms and signs involving the musculoskeletal system: Secondary | ICD-10-CM | POA: Diagnosis not present

## 2019-06-07 DIAGNOSIS — E519 Thiamine deficiency, unspecified: Secondary | ICD-10-CM | POA: Diagnosis not present

## 2019-06-07 DIAGNOSIS — R42 Dizziness and giddiness: Secondary | ICD-10-CM | POA: Diagnosis not present

## 2019-06-07 DIAGNOSIS — E538 Deficiency of other specified B group vitamins: Secondary | ICD-10-CM | POA: Diagnosis not present

## 2019-06-07 DIAGNOSIS — R200 Anesthesia of skin: Secondary | ICD-10-CM | POA: Diagnosis not present

## 2019-06-07 DIAGNOSIS — E531 Pyridoxine deficiency: Secondary | ICD-10-CM | POA: Diagnosis not present

## 2019-06-07 DIAGNOSIS — R202 Paresthesia of skin: Secondary | ICD-10-CM | POA: Diagnosis not present

## 2019-06-15 ENCOUNTER — Other Ambulatory Visit: Payer: Self-pay | Admitting: Neurology

## 2019-06-15 DIAGNOSIS — R42 Dizziness and giddiness: Secondary | ICD-10-CM

## 2019-06-16 ENCOUNTER — Ambulatory Visit (INDEPENDENT_AMBULATORY_CARE_PROVIDER_SITE_OTHER): Payer: Self-pay | Admitting: Family Medicine

## 2019-06-16 ENCOUNTER — Ambulatory Visit (INDEPENDENT_AMBULATORY_CARE_PROVIDER_SITE_OTHER): Payer: Self-pay | Admitting: Bariatrics

## 2019-06-17 ENCOUNTER — Encounter (INDEPENDENT_AMBULATORY_CARE_PROVIDER_SITE_OTHER): Payer: Self-pay | Admitting: Bariatrics

## 2019-06-21 ENCOUNTER — Other Ambulatory Visit: Payer: Self-pay

## 2019-06-21 ENCOUNTER — Encounter (INDEPENDENT_AMBULATORY_CARE_PROVIDER_SITE_OTHER): Payer: Self-pay | Admitting: Bariatrics

## 2019-06-21 ENCOUNTER — Ambulatory Visit (INDEPENDENT_AMBULATORY_CARE_PROVIDER_SITE_OTHER): Payer: 59 | Admitting: Bariatrics

## 2019-06-21 VITALS — BP 123/71 | HR 63 | Temp 98.6°F | Ht 67.0 in | Wt 241.0 lb

## 2019-06-21 DIAGNOSIS — E538 Deficiency of other specified B group vitamins: Secondary | ICD-10-CM | POA: Diagnosis not present

## 2019-06-21 DIAGNOSIS — Z9189 Other specified personal risk factors, not elsewhere classified: Secondary | ICD-10-CM | POA: Diagnosis not present

## 2019-06-21 DIAGNOSIS — E559 Vitamin D deficiency, unspecified: Secondary | ICD-10-CM

## 2019-06-21 DIAGNOSIS — E119 Type 2 diabetes mellitus without complications: Secondary | ICD-10-CM | POA: Diagnosis not present

## 2019-06-21 DIAGNOSIS — R0602 Shortness of breath: Secondary | ICD-10-CM

## 2019-06-21 DIAGNOSIS — R69 Illness, unspecified: Secondary | ICD-10-CM | POA: Diagnosis not present

## 2019-06-21 DIAGNOSIS — R5383 Other fatigue: Secondary | ICD-10-CM

## 2019-06-21 DIAGNOSIS — Z6837 Body mass index (BMI) 37.0-37.9, adult: Secondary | ICD-10-CM

## 2019-06-21 DIAGNOSIS — Z0289 Encounter for other administrative examinations: Secondary | ICD-10-CM

## 2019-06-21 DIAGNOSIS — F3289 Other specified depressive episodes: Secondary | ICD-10-CM

## 2019-06-21 DIAGNOSIS — I10 Essential (primary) hypertension: Secondary | ICD-10-CM | POA: Diagnosis not present

## 2019-06-21 NOTE — Progress Notes (Signed)
.  Office: [phone]  /  Fax: [phone]   HPI:   Chief Complaint: OBESITY  Tara Wood (MR# [id number]) is a 53 y.o. female who presents on 06/21/2019 for obesity evaluation and treatment. Current BMI is Body mass index is 37.75 kg/m.Marland Kitchen Tara Wood has struggled with obesity for years and has been unsuccessful in either losing weight or maintaining long term weight loss. Tara Wood states she is currently in the action stage of change and ready to dedicate time achieving and maintaining a healthier weight.   Tara Wood likes to cook. She craves salty foods. She does not eat meat with the exception of fish and shrimp. She sometimes snacks at night. She skips breakfast.  Tara Wood states her family eats meals together she thinks her family will eat healthier with her her desired weight loss is 71-91 lbs she started gaining weight in 2012 her heaviest weight ever was 249 lbs. she snacks sometimes in the evenings she skips breakfast frequently she is frequently drinking liquids with calories she sometimes makes poor food choices she sometimes eats larger portions than normal  she has binge eating behaviors she struggles with emotional eating    Fatigue Tara Wood feels her energy is lower than it should be. This has worsened with weight gain and has worsened recently. Tara Wood admits to daytime somnolence and  admits to waking up still tired. Patient is at risk for obstructive sleep apnea. Patent has a history of symptoms of daytime fatigue and morning headache. Patient generally gets 5-7 hours of sleep per night, and states they generally do not sleep well most nights. Snoring is not present. Apneic episodes are not present. Epworth Sleepiness Score is 5.  Dyspnea on exertion Tara Wood notes increasing shortness of breath with exercising and seems to be worsening over time with weight gain. She notes getting out of breath sooner with activity than she used to. This has gotten worse recently.  Tara Wood denies orthopnea.  Vitamin D deficiency Tara Wood has a diagnosis of Vitamin D deficiency. She denies nausea, vomiting or muscle weakness.  At risk for osteopenia and osteoporosis Tara Wood is at higher risk of osteopenia and osteoporosis due to Vitamin D deficiency.   Hypertension (Controlled) Tara Wood is a 53 y.o. female with hypertension and is taking HCTZ, Cozaar, and spironolactone. Tara Wood denies chest pain or shortness of breath on exertion. She is working weight loss to help control her blood pressure with the goal of decreasing her risk of heart attack and stroke. Tara Wood's blood pressure is 123/71.   Vitamin B12 deficiency Tara Wood has a diagnosis of B12 insufficiency and takes OTC B12 supplementation.  Diabetes II (New Diagnosis) Tara Wood has a new diagnosis of diabetes type II. Tara Wood states fasting blood sugars average 140. Last A1c was reported to be 6.5 on 06/07/2019. Tara Wood states diabetes does not run in her family. She has been working on intensive lifestyle modifications including diet, exercise, and weight loss to help control her blood glucose levels.  Depression Screen Tara Wood's Food and Mood (modified PHQ-9) score was 7. Depression screen Tara Wood 2/9 06/21/2019  Decreased Interest 2  Down, Depressed, Hopeless 0  PHQ - 2 Score 2  Altered sleeping 0  Tired, decreased energy 2  Change in appetite 1  Feeling bad or failure about yourself  0  Trouble concentrating 1  Moving slowly or fidgety/restless 1  Suicidal thoughts 0  PHQ-9 Score 7  Difficult doing work/chores Not difficult at all    ASSESSMENT AND PLAN:  Other fatigue - Plan: EKG 12-Lead  Shortness of breath on exertion  Vitamin D deficiency - Plan: Vitamin D (25 hydroxy)  Essential hypertension  Type 2 diabetes mellitus without complication, without long-term current use of insulin (HCC) - Plan: Comprehensive Metabolic Panel (CMET)  B12 nutritional deficiency -  Plan: B12  Other depression, emotional eating  At risk for osteoporosis  Class 2 severe obesity with serious comorbidity and body mass index (BMI) of 37.0 to 37.9 in adult, unspecified obesity type (HCC)  PLAN:  Fatigue Tara Wood was informed that her fatigue may be related to obesity, depression or many other causes. Labs will be ordered, and in the meanwhile Tara Wood has agreed to work on diet, exercise and weight loss to help with fatigue. Proper sleep hygiene was discussed including the need for 7-8 hours of quality sleep each night. A sleep study was not ordered based on symptoms and Epworth score. Tara Wood will gradually increase exercise.  Dyspnea on exertion Tara Wood's shortness of breath appears to be obesity related and exercise induced. She has agreed to work on weight loss and gradually increase exercise to treat her exercise induced shortness of breath. If Tara Wood follows our instructions and loses weight without improvement of her shortness of breath, we will plan to refer to pulmonology. We will monitor this condition regularly. Tara Wood agrees to this plan.  Vitamin D Deficiency Tara Wood was informed that low Vitamin D levels contributes to fatigue and are associated with obesity, breast, and colon cancer. She will have routine testing of Vitamin D and follow-up with our clinic in 2-3 weeks.  At risk for osteopenia and osteoporosis Tara Wood was given extended  (15 minutes) osteoporosis prevention counseling today. Tara Wood is at risk for osteopenia and osteoporosis due to her Vitamin D deficiency. She was encouraged to follow her higher calcium diet and increase strengthening exercise to help strengthen her bones and decrease her risk of osteopenia and osteoporosis.  Hypertension (Controlled) We discussed sodium restriction, working on healthy weight loss, and a regular exercise program as the means to achieve improved blood pressure control. Tara Wood agreed with this plan and agreed to follow  up as directed. We will continue to monitor her blood pressure as well as her progress with the above lifestyle modifications. Tara Wood will work on meal planning and intentional eating. She will continue her medications as prescribed and will watch for signs of hypotension as she continues her lifestyle modifications.  Vitamin B12 deficiency Lyndia will have B12 level checked today and follow-up with our clinic in 2-3 weeks for review of lab results.  Diabetes II (New Diagnosis) Arleta has been given extensive diabetes education by myself today including ideal fasting and post-prandial blood glucose readings, individual ideal HgA1c goals  and hypoglycemia prevention. We discussed the importance of good blood sugar control to decrease the likelihood of diabetic complications such as nephropathy, neuropathy, limb loss, blindness, coronary artery disease, and death. We discussed the importance of intensive lifestyle modification including diet, exercise and weight loss as the first line treatment for diabetes. Declynn will have insulin checked today and have A1c rechecked in 3 months.  Depression Screen Deleah had a mildly positive depression screening. Depression is commonly associated with obesity and often results in emotional eating behaviors. We will monitor this closely and work on CBT to help improve the non-hunger eating patterns. Referral to Psychology may be required if no improvement is seen as she continues in our clinic.  Obesity Tara Wood is currently in the action stage of change and  her goal is to continue with weight loss efforts. She has agreed to follow the Category 3 plan + 100 calories. Jackolyn will stop all sugary drinks, will not skip breakfast, and will stop mindless eating. We reviewed her labs today. Annikah has been instructed to work up to a goal of 150 minutes of combined cardio and strengthening exercise per week for weight loss and overall health benefits. We discussed the  following Behavioral Modification Strategies today: increasing lean protein intake, decreasing simple carbohydrates, increasing vegetables, increase H20 intake, decrease eating out, no skipping meals, work on meal planning and easy cooking plans, keeping healthy foods in the home, ways to avoid boredom eating, holiday eating strategies, and celebration eating strategies.  Tara Wood has agreed to follow-up with our clinic in 2-3 weeks. She was informed of the importance of frequent follow-up visits to maximize her success with intensive lifestyle modifications for her multiple health conditions. She was informed we would discuss her lab results at her next visit unless there is a critical issue that needs to be addressed sooner. Tara Wood agreed to keep her next visit at the agreed upon time to discuss these results.  ALLERGIES: No Known Allergies  MEDICATIONS: Current Outpatient Medications on File Prior to Visit  Medication Sig Dispense Refill  . Cholecalciferol (VITAMIN D3) 125 MCG (5000 UT) TABS Take by mouth.    . cyanocobalamin 1000 MCG tablet Take 1,000 mcg by mouth daily.    . fexofenadine (ALLEGRA) 180 MG tablet Take 180 mg by mouth daily.    Marland Kitchen. spironolactone (ALDACTONE) 25 MG tablet     . terbinafine (LAMISIL) 1 % cream Apply 1 application topically 2 (two) times daily.     No current facility-administered medications on file prior to visit.     PAST MEDICAL HISTORY: Past Medical History:  Diagnosis Date  . B12 deficiency   . Borderline high blood pressure   . Chest pain   . Joint pain   . Obesity   . Palpitations   . PVC (premature ventricular contraction)   . Reactive airway disease   . SOB (shortness of breath)   . Swallowing difficulty   . Swelling of right foot   . Vitamin D deficiency     PAST SURGICAL HISTORY: Past Surgical History:  Procedure Laterality Date  . TUBAL LIGATION      SOCIAL HISTORY: Social History   Tobacco Use  . Smoking status: Never Smoker   . Smokeless tobacco: Never Used  Substance Use Topics  . Alcohol use: Not Currently  . Drug use: Not Currently    FAMILY HISTORY: Family History  Problem Relation Age of Onset  . Heart failure Mother   . Parkinson's disease Mother   . Heart disease Mother   . Stroke Father   . High blood pressure Father    ROS: Review of Systems  Constitutional: Positive for malaise/fatigue.  HENT:       Positive for mouth sores.  Eyes:       Positive for wearing glasses or contacts.  Respiratory: Negative for shortness of breath.   Cardiovascular: Positive for palpitations. Negative for chest pain and orthopnea.  Gastrointestinal: Negative for nausea and vomiting.       Positive for swallowing difficulty.  Musculoskeletal: Positive for joint pain.       Negative for muscle weakness. Positive for muscle stiffness.  Skin: Positive for rash.  Neurological: Positive for weakness.  Psychiatric/Behavioral: The patient has insomnia.    PHYSICAL EXAM:Blood pressure 123/71, pulse  63, temperature 98.6 F (37 C), height 5\' 7"  (1.702 m), weight 241 lb (109.3 kg), last menstrual period 04/29/2019, SpO2 97 %. Body mass index is 37.75 kg/m. Physical Exam Vitals signs reviewed.  Constitutional:      Appearance: Normal appearance. She is well-developed. She is obese.  HENT:     Head: Normocephalic and atraumatic.     Nose: Nose normal.  Eyes:     General: No scleral icterus. Neck:     Musculoskeletal: Normal range of motion.  Cardiovascular:     Rate and Rhythm: Normal rate and regular rhythm.  Pulmonary:     Effort: Pulmonary effort is normal. No respiratory distress.  Abdominal:     Palpations: Abdomen is soft.     Tenderness: There is no abdominal tenderness.  Musculoskeletal: Normal range of motion.     Comments: Range of motion normal in all four extremities.  Skin:    General: Skin is warm and dry.  Neurological:     Mental Status: She is alert and oriented to person, place, and  time.     Coordination: Coordination normal.  Psychiatric:        Mood and Affect: Mood and affect normal.        Behavior: Behavior normal.   RECENT LABS AND TESTS: BMET No results found for: NA, K, CL, CO2, GLUCOSE, BUN, CREATININE, CALCIUM, GFRNONAA, GFRAA No results found for: HGBA1C No results found for: INSULIN CBC No results found for: WBC, RBC, HGB, HCT, PLT, MCV, MCH, MCHC, RDW, LYMPHSABS, MONOABS, EOSABS, BASOSABS Iron/TIBC/Ferritin/ %Sat No results found for: IRON, TIBC, FERRITIN, IRONPCTSAT Lipid Panel  No results found for: CHOL, TRIG, HDL, CHOLHDL, VLDL, LDLCALC, LDLDIRECT Hepatic Function Panel  No results found for: PROT, ALBUMIN, AST, ALT, ALKPHOS, BILITOT, BILIDIR, IBILI No results found for: TSH  No results found for Vitamin D, 25-Hydroxy  INDIRECT CALORIMETER done today shows a VO2 of 300 and a REE of 2085. Her calculated basal metabolic rate is 7628 thus her basal metabolic rate is better than expected.  OBESITY BEHAVIORAL INTERVENTION VISIT  Today's visit was #1  Starting weight: 241 lbs Starting date: 06/21/2019 Today's weight: 241 lbs  Today's date: 06/21/2019 Total lbs lost to date: 0     06/21/2019  Height 5\' 7"  (1.702 m)  Weight 241 lb (109.3 kg)  BMI (Calculated) 37.74  BLOOD PRESSURE - SYSTOLIC 315  BLOOD PRESSURE - DIASTOLIC 71  Waist Measurement  50 inches   Body Fat % 46.5 %  Total Body Water (lbs) 91.8 lbs  RMR 2085   ASK: We discussed the diagnosis of obesity with Marymargaret C Hulen Luster today and Jenina agreed to give [name] permission to discuss obesity behavioral modification therapy today.  ASSESS: Samera has the diagnosis of obesity and her BMI today is 37.9. Jacquese is in the action stage of change.   ADVISE: Jaydalynn was educated on the multiple health risks of obesity as well as the benefit of weight loss to improve her health. She was advised of the need for long term treatment and the importance of lifestyle modifications  to improve her current health and to decrease her risk of future health problems.  AGREE: Multiple dietary modification options and treatment options were discussed and  Amaryllis agreed to follow the recommendations documented in the above note.  ARRANGE: Jamina was educated on the importance of frequent visits to treat obesity as outlined per CMS and USPSTF guidelines and agreed to schedule her next follow up appointment today.  Fernanda Drum, am acting as Energy manager for [employer], DO   I have reviewed the above documentation for accuracy and completeness, and I agree with the above. -Corinna Capra, DO

## 2019-06-22 ENCOUNTER — Encounter (INDEPENDENT_AMBULATORY_CARE_PROVIDER_SITE_OTHER): Payer: Self-pay | Admitting: Bariatrics

## 2019-06-22 LAB — COMPREHENSIVE METABOLIC PANEL
ALT: 33 IU/L — ABNORMAL HIGH (ref 0–32)
AST: 23 IU/L (ref 0–40)
Albumin/Globulin Ratio: 1.2 (ref 1.2–2.2)
Albumin: 4.1 g/dL (ref 3.8–4.9)
Alkaline Phosphatase: 91 IU/L (ref 39–117)
BUN/Creatinine Ratio: 19 (ref 9–23)
BUN: 13 mg/dL (ref 6–24)
Bilirubin Total: 0.6 mg/dL (ref 0.0–1.2)
CO2: 25 mmol/L (ref 20–29)
Calcium: 9.9 mg/dL (ref 8.7–10.2)
Chloride: 100 mmol/L (ref 96–106)
Creatinine, Ser: 0.67 mg/dL (ref 0.57–1.00)
GFR calc Af Amer: 117 mL/min/{1.73_m2} (ref >59)
GFR calc non Af Amer: 101 mL/min/{1.73_m2} (ref >59)
Globulin, Total: 3.4 g/dL (ref 1.5–4.5)
Glucose: 110 mg/dL — ABNORMAL HIGH (ref 65–99)
Potassium: 3.8 mmol/L (ref 3.5–5.2)
Sodium: 142 mmol/L (ref 134–144)
Total Protein: 7.5 g/dL (ref 6.0–8.5)

## 2019-06-22 LAB — VITAMIN B12: Vitamin B-12: 408 pg/mL (ref 232–1245)

## 2019-06-22 LAB — VITAMIN D 25 HYDROXY (VIT D DEFICIENCY, FRACTURES): Vit D, 25-Hydroxy: 50.3 ng/mL (ref 30.0–100.0)

## 2019-06-26 ENCOUNTER — Ambulatory Visit: Payer: 59

## 2019-07-02 ENCOUNTER — Ambulatory Visit
Admission: EM | Admit: 2019-07-02 | Discharge: 2019-07-02 | Disposition: A | Discharge status: Home / Self Care | Payer: 59 | Attending: Urgent Care | Admitting: Urgent Care

## 2019-07-02 ENCOUNTER — Ambulatory Visit (INDEPENDENT_AMBULATORY_CARE_PROVIDER_SITE_OTHER): Payer: 59

## 2019-07-02 ENCOUNTER — Other Ambulatory Visit: Payer: Self-pay

## 2019-07-02 ENCOUNTER — Encounter: Payer: Self-pay | Admitting: Emergency Medicine

## 2019-07-02 DIAGNOSIS — M542 Cervicalgia: Secondary | ICD-10-CM

## 2019-07-02 DIAGNOSIS — W19XXXA Unspecified fall, initial encounter: Secondary | ICD-10-CM

## 2019-07-02 DIAGNOSIS — S161XXA Strain of muscle, fascia and tendon at neck level, initial encounter: Secondary | ICD-10-CM

## 2019-07-02 DIAGNOSIS — S99921A Unspecified injury of right foot, initial encounter: Secondary | ICD-10-CM | POA: Diagnosis not present

## 2019-07-02 DIAGNOSIS — W109XXA Fall (on) (from) unspecified stairs and steps, initial encounter: Secondary | ICD-10-CM

## 2019-07-02 DIAGNOSIS — M79671 Pain in right foot: Secondary | ICD-10-CM

## 2019-07-02 DIAGNOSIS — M7711 Lateral epicondylitis, right elbow: Secondary | ICD-10-CM | POA: Diagnosis not present

## 2019-07-02 MED ORDER — METHYLPREDNISOLONE 4 MG PO TBPK: ORAL_TABLET | ORAL | 0 refills | Status: DC

## 2019-07-02 NOTE — ED Triage Notes (Signed)
Patient states that she slipped and fell on her right foot yesterday.  Patient also c/o neck pain that goes down her upper back.  Patient would also to have her right elbow looked at.  Patient c/o pain in her right elbow prior to her fall.

## 2019-07-02 NOTE — Discharge Instructions (Signed)
It was very nice seeing you today in clinic. Thank you for entrusting me with your care.   May use Tylenol as needed for pain. Apply moist heat to neck/upper back to help with pain. Will send in prescription for steroid course to help with elbow inflammation. Try to rest and take it easy for the next few days.    Make arrangements to follow up with your regular doctor in 1 week for re-evaluation if not improving. If your symptoms/condition worsens, please seek follow up care either here or in the ER. Please remember, our [facility] providers are "right here with you" when you need [name].   Again, it was my pleasure to take care of you today. Thank you for choosing our clinic. I hope that you start to feel better quickly.   Honor Loh, MSN, APRN, FNP-C, CEN Advanced Practice Provider [facility]

## 2019-07-03 NOTE — ED Provider Notes (Signed)
[place], [place]   Name: Tara Wood DOB: Feb 24, 1966 MRN: [id number] CSAlmyra Brace: [id number] PCP: Clent Jacksovington, Tara, Cordelia PochePA-C  Arrival date and time:  07/02/19 1107  Chief Complaint:  Foot Pain, Elbow Pain, Neck Pain, and Fall   NOTE: Prior to seeing the patient today, I have reviewed the triage nursing documentation and vital signs. Clinical staff has updated patient's PMH/PSHx, current medication list, and drug allergies/intolerances to ensure comprehensive history available to assist in medical decision making.   History:   HPI: Tara Wood is a 53 y.o. female who presents today with complaints of multiple complaints as follows:  Foot and neck pain:  Patient reports pain in her RIGHT foot and to the base of her neck/shoulders following a fall yesterday. Patient reports that she was going down the steps and slipped causing her to fall. Patient advising that pain in her foot mainly overlies her last three toes and lateral aspect of her foot. She has appreciated some minor swelling and bruising. Patient denies previous injuries to her RIGHT foot/ankle; no surgeries. When she fell, she feels she also pulled something in her neck. She denies hitting her head/neck during the fall; no LOC. She notes that FROM remains intact and she is having some minor soreness. She has taken APAP for the pain, which has helped some. She also notes that pain in her foot has been improved by her wearing hard bottomed shoes as opposed to her tennis shoes.   Elbow pain: Patient reporting pain in her RIGHT elbow that is unrelated to her fall yesterday. Pain has been present to some degree for about a month. Pain just inferior to the elbow joint and intermittent distal paresthesias. Patient notes that she has been trying to train her dog and hits the back of her couch a lot in efforts to correct the animal's behavior. She notes that his increases her pain. She intermittently uses APAP for her pain.    Past Medical History:  Diagnosis Date  . B12 deficiency   . Borderline high blood pressure   . Chest pain   . Joint pain   . Obesity   . Palpitations   . PVC (premature ventricular contraction)   . Reactive airway disease   . SOB (shortness of breath)   . Swallowing difficulty   . Swelling of right foot   . Vitamin D deficiency     Past Surgical History:  Procedure Laterality Date  . TUBAL LIGATION      Family History  Problem Relation Age of Onset  . Heart failure Mother   . Parkinson's disease Mother   . Heart disease Mother   . Stroke Father   . High blood pressure Father     Social History   Tobacco Use  . Smoking status: Never Smoker  . Smokeless tobacco: Never Used  Substance Use Topics  . Alcohol use: Not Currently  . Drug use: Not Currently    Patient Active Problem List   Diagnosis Date Noted  . Polyarthralgia 01/11/2019  . Essential hypertension 02/05/2017    Home Medications:    Current Meds  Medication Sig  . Cholecalciferol (VITAMIN D3) 125 MCG (5000 UT) TABS Take by mouth.  . cyanocobalamin 1000 MCG tablet Take 1,000 mcg by mouth daily.  Marland Kitchen. spironolactone (ALDACTONE) 25 MG tablet     Allergies:   Patient has no known allergies.  Review of Systems (ROS): Review of Systems  Constitutional: Negative for chills and fever.  Respiratory: Negative  for cough and shortness of breath.   Cardiovascular: Negative for chest pain and palpitations.  Musculoskeletal: Positive for neck pain and neck stiffness. Negative for back pain and gait problem.       Pain in RIGHT elbow and ankle.  Skin: Negative for color change, pallor and rash.  Neurological: Negative for dizziness and weakness.  All other systems reviewed and are negative.    Vital Signs: Today's Vitals   07/02/19 1123 07/02/19 1126 07/02/19 1224  BP:  115/78   Pulse:  77   Resp:  16   Temp:  98.4 F (36.9 C)   TempSrc:  Oral   SpO2:  98%   Weight: 241 lb (109.3 kg)    Height: 5'  7" (1.702 m)    PainSc: 5   5     Physical Exam: Physical Exam  Constitutional: She is oriented to person, place, and time and well-developed, well-nourished, and in no distress.  HENT:  Head: Normocephalic and atraumatic.  Mouth/Throat: Mucous membranes are normal.  Eyes: Pupils are equal, round, and reactive to light. EOM are normal.  Neck: Normal range of motion. Neck supple. Muscular tenderness present. Normal range of motion present.    No midline pain or gross deformities.  Cardiovascular: Normal rate, regular rhythm, normal heart sounds and intact distal pulses.  Pulmonary/Chest: Effort normal and breath sounds normal.  Musculoskeletal:     Right elbow: She exhibits normal range of motion, no swelling and no deformity. Tenderness found. Lateral epicondyle tenderness noted.     Right foot: Normal range of motion and normal capillary refill. Tenderness and swelling (mild) present. No crepitus or deformity.       Feet:     Comments: Minimal bruising noted to 5th toe on RIGHT foot.  Neurological: She is alert and oriented to person, place, and time. Gait normal.  Skin: Skin is warm and dry. No rash noted.  Psychiatric: Mood, memory, affect and judgment normal.  Nursing note and vitals reviewed.   Urgent Care Treatments / Results:   LABS: PLEASE NOTE: all labs that were ordered this encounter are listed, however only abnormal results are displayed. Labs Reviewed - No data to display  EKG: -None  RADIOLOGY: Dg Foot Complete Right  Result Date: 07/02/2019 CLINICAL DATA:  Right foot pain after a slip and fall yesterday. Initial encounter. EXAM: RIGHT FOOT COMPLETE - 3+ VIEW COMPARISON:  None. FINDINGS: There is no evidence of fracture or dislocation. There is no evidence of arthropathy or other focal bone abnormality. Small plantar calcaneal spurs noted. Soft tissues are unremarkable. IMPRESSION: Negative exam. Electronically Signed   By: Inge Rise M.D.   On: 07/02/2019  12:03    PROCEDURES: Procedures  MEDICATIONS RECEIVED THIS VISIT: Medications - No data to display  PERTINENT CLINICAL COURSE NOTES/UPDATES:   Initial Impression / Assessment and Plan / Urgent Care Course:  Pertinent labs & imaging results that were available during my care of the patient were personally reviewed by me and considered in my medical decision making (see lab/imaging section of note for values and interpretations).  Tara Wood is a 53 y.o. female who presents to [facility] today with complaints of Foot Pain, Elbow Pain, Neck Pain, and Fall   Patient is well appearing overall in clinic today. She does not appear to be in any acute distress. Presenting symptoms (see HPI) and exam as documented above. Patient has multiple sites of pain. Will address today's complaints as follows:  Neck pain - pain describes as a mild soreness at the base of neck (cervical back). There are no deformities or midline tenderness. She has FROM without increased pain. Suspect strain. Discussed ROM exercises stretching.  Encouraged to apply moist heat BID-TID for at least 15-20 minutes at a time. May continue APAP as needed for pain. If not improving, patient will need to follow up with PCP to discuss further evaluation.    Elbow pain - pain has been persistent for a month. Patient describes repetitive motions. Pain overlying lateral aspect of elbow with an ulnar distribution. She describes intermittent tingling in her arm. Suspect lateral epicondylitis. Will start on systemic steroid taper. Patient encouraged to rest elbow and apply ice over the next few days to allow for improvement in her pain. Discussed over the counter elbow strap that may also be beneficial in improving her symptoms. Once steroids completed, patient may use IBU as needed for recurrent/persistent symptoms. If not improving, may benefit from further evaluation by orthopedics.    Foot pain - pain overlying  last three digits and lateral aspect of her foot. Diagnostic radiographs of the RIGHT foot revealed no acute abnormalities; no fracture, dislocation, or effusion. Again, suspect muscle strain. May continue the APAP. Steroids prescribed for elbow may also help. Will place in compression wrap for comfort. Patient encouraged to rest, ice, and elevate her foot. Will follow up with PCP if not improving.   Discussed follow up with primary care physician in 1 week for re-evaluation. I have reviewed the follow up and strict return precautions for any new or worsening symptoms. Patient is aware of symptoms that would be deemed urgent/emergent, and would thus require further evaluation either here or in the emergency department. At the time of discharge, she verbalized understanding and consent with the discharge plan as it was reviewed with her. All questions were fielded by provider and/or clinic staff prior to patient discharge.    Final Clinical Impressions / Urgent Care Diagnoses:   Final diagnoses:  Foot pain, right  Fall, initial encounter  Strain of neck muscle, initial encounter  Lateral epicondylitis of right elbow    New Prescriptions:  [place] Controlled Substance Registry consulted? Not Applicable  Meds ordered this encounter  Medications  . methylPREDNISolone (MEDROL DOSEPAK) 4 MG TBPK tablet    Sig: Take by mouth daily - taper daily dose per package instructions.    Dispense:  21 tablet    Refill:  0    Recommended Follow up Care:  Patient encouraged to follow up with the following provider within the specified time frame, or sooner as dictated by the severity of her symptoms. As always, she was instructed that for any urgent/emergent care needs, she should seek care either here or in the emergency department for more immediate evaluation.  Follow-up Information    Clent Jacks, [place] In 1 week.   Specialty: Physician Assistant Why: General reassessment of symptoms if not improving  Contact information: [address] [facility] [place]  [id number] [phone]         NOTE: This note was prepared using Dragon dictation software along with smaller phrase technology. Despite my best ability to proofread, there is the potential that transcriptional errors may still occur from this process, and are completely unintentional.    Verlee Monte, NP 07/03/19 [phone]

## 2019-07-05 ENCOUNTER — Ambulatory Visit (INDEPENDENT_AMBULATORY_CARE_PROVIDER_SITE_OTHER): Payer: 59 | Admitting: Bariatrics

## 2019-07-05 ENCOUNTER — Encounter (INDEPENDENT_AMBULATORY_CARE_PROVIDER_SITE_OTHER): Payer: Self-pay | Admitting: Bariatrics

## 2019-07-05 ENCOUNTER — Other Ambulatory Visit: Payer: Self-pay

## 2019-07-05 VITALS — BP 115/78 | HR 68 | Temp 98.3°F | Ht 67.0 in | Wt 237.0 lb

## 2019-07-05 DIAGNOSIS — Z6837 Body mass index (BMI) 37.0-37.9, adult: Secondary | ICD-10-CM

## 2019-07-05 DIAGNOSIS — E559 Vitamin D deficiency, unspecified: Secondary | ICD-10-CM | POA: Diagnosis not present

## 2019-07-05 DIAGNOSIS — I10 Essential (primary) hypertension: Secondary | ICD-10-CM | POA: Diagnosis not present

## 2019-07-05 DIAGNOSIS — E119 Type 2 diabetes mellitus without complications: Secondary | ICD-10-CM

## 2019-07-05 DIAGNOSIS — R7989 Other specified abnormal findings of blood chemistry: Secondary | ICD-10-CM | POA: Diagnosis not present

## 2019-07-05 DIAGNOSIS — K5909 Other constipation: Secondary | ICD-10-CM | POA: Diagnosis not present

## 2019-07-06 ENCOUNTER — Encounter (INDEPENDENT_AMBULATORY_CARE_PROVIDER_SITE_OTHER): Payer: Self-pay | Admitting: Bariatrics

## 2019-07-06 NOTE — Progress Notes (Signed)
Office: [phone]  /  Fax: [phone]   HPI:   Chief Complaint: OBESITY Tara Wood is here to discuss her progress with her obesity treatment plan. She is on the Category 3 plan + 100 calories and is following her eating plan approximately 97% of the time. She states she is exercising 0 minutes 0 times per week. Tara Wood is down 4 lbs and is surprised that she lost weight. She states that the amount of food was a lot. Her weight is 241 lb (109.3 kg) today and has had a weight loss of 4 pounds over a period of 2 weeks since her last visit. She has lost 4 lbs since starting treatment with us.  Hypertension Tara Wood is a 53 y.o. female with hypertension and is taking spironolactone. Tara Wood denies chest pain or shortness of breath on exertion. She is working weight loss to help control her blood pressure with the goal of decreasing her risk of heart attack and stroke. Tara Wood blood pressure is well controlled.  Diabetes II Tara Wood has a diagnosis of diabetes type II. Tara Wood does not report checking her blood sugars. Last A1c was was reported to be 6.5 on 06/07/2019. She has been working on intensive lifestyle modifications including diet, exercise, and weight loss to help control her blood glucose levels.  Vitamin D deficiency Tara Wood has a diagnosis of Vitamin D deficiency. Last Vitamin D 50.3 on 06/21/2019. She is currently taking Vit D 5,000 daily and denies nausea, vomiting or muscle weakness.  Elevated ALT Tara Wood was noted to have an elevated ALT of 33 on labs dated 06/21/2019. Abdominal ultrasound in 2015 was reported to be normal.  Constipation Tara Wood notes bowel movements are not as regular. She thinks this is related to bread, cheese, and increased protein.  ASSESSMENT AND PLAN:  Essential hypertension  Type 2 diabetes mellitus without complication, without long-term current use of insulin (HCC)  Vitamin D deficiency  Other constipation   Elevated LFTs  Class 2 severe obesity with serious comorbidity and body mass index (BMI) of 37.0 to 37.9 in adult, unspecified obesity type (HCC)  PLAN:  Hypertension Tara Wood is working on healthy weight loss and exercise to improve blood pressure control. We will watch for signs of hypotension as she continues her lifestyle modifications. Tara Wood will continue spironolactone and follow-up as directed.  Diabetes II Tara Wood has been given diabetes education by myself today. Good blood sugar control is important to decrease the likelihood of diabetic complications such as nephropathy, neuropathy, limb loss, blindness, coronary artery disease, and death. Intensive lifestyle modification including diet, exercise and weight loss were discussed as the first line treatment for diabetes. We do not have the result of the A1c on the chart. Will have repeat A1c 09/06/2018.   Vitamin D Deficiency Tara Wood was informed that low Vitamin D levels contributes to fatigue and are associated with obesity, breast, and colon cancer. She agrees to continue taking OTC Vit D and will follow-up for routine testing of Vitamin D, at least 2-3 times per year. She was informed of the risk of over-replacement of Vitamin D and agrees to not increase her dose unless she discusses this with us first. Tara Wood agrees to follow-up with our clinic in 2 weeks.  Elevated ALT Tara Wood was advised we will follow this over time.  Constipation Tara Wood was informed decrease bowel movement frequency is normal while losing weight, but stools should not be hard or painful. She was advised to increase her H20 intake, increase  raw vegetables, and use Citracal, Metamucil, or MiraLax OTC as needed.  I spent > than 50% of the 40 minute visit on counseling as documented in the note.  TIME SPENT: 35 minutes.  Obesity Tara Wood is currently in the action stage of change. As such, her goal is to continue with weight loss efforts She has agreed to  follow the Category 3 plan + 100 calories. Tara Wood will work on meal planning. She was given handout on [employer]. Tara Wood has been instructed to start walking 1/2 mile 2 times per week for weight loss and overall health benefits. We discussed the following Behavioral Modification Strategies today: increasing lean protein intake, decreasing simple carbohydrates, increasing vegetables, increase H20 intake, decrease eating out, no skipping meals, work on meal planning and easy cooking plans, keeping healthy foods in the home, and planning for success.  Tara Wood has agreed to follow-up with our clinic in 2 weeks. She was informed of the importance of frequent follow-up visits to maximize her success with intensive lifestyle modifications for her multiple health conditions.  ALLERGIES: No Known Allergies  MEDICATIONS: Current Outpatient Medications on File Prior to Visit  Medication Sig Dispense Refill  . Cholecalciferol (VITAMIN D3) 125 MCG (5000 UT) TABS Take by mouth.    . cyanocobalamin 1000 MCG tablet Take 1,000 mcg by mouth daily.    . methylPREDNISolone (MEDROL DOSEPAK) 4 MG TBPK tablet Take by mouth daily - taper daily dose per package instructions. 21 tablet 0  . spironolactone (ALDACTONE) 25 MG tablet     . terbinafine (LAMISIL) 1 % cream Apply 1 application topically 2 (two) times daily.    . [DISCONTINUED] fexofenadine (ALLEGRA) 180 MG tablet Take 180 mg by mouth daily.     No current facility-administered medications on file prior to visit.     PAST MEDICAL HISTORY: Past Medical History:  Diagnosis Date  . B12 deficiency   . Borderline high blood pressure   . Chest pain   . Joint pain   . Obesity   . Palpitations   . PVC (premature ventricular contraction)   . Reactive airway disease   . SOB (shortness of breath)   . Swallowing difficulty   . Swelling of right foot   . Vitamin D deficiency     PAST SURGICAL HISTORY: Past Surgical History:  Procedure Laterality  Date  . TUBAL LIGATION      SOCIAL HISTORY: Social History   Tobacco Use  . Smoking status: Never Smoker  . Smokeless tobacco: Never Used  Substance Use Topics  . Alcohol use: Not Currently  . Drug use: Not Currently    FAMILY HISTORY: Family History  Problem Relation Age of Onset  . Heart failure Mother   . Parkinson's disease Mother   . Heart disease Mother   . Stroke Father   . High blood pressure Father    ROS: Review of Systems  Respiratory: Negative for shortness of breath.   Cardiovascular: Negative for chest pain.  Gastrointestinal: Positive for constipation. Negative for nausea and vomiting.  Musculoskeletal:       Negative for muscle weakness.   PHYSICAL EXAM: Blood pressure 115/78, pulse 68, temperature 98.3 F (36.8 C), height 5\' 7"  (1.702 m), weight 241 lb (109.3 kg), SpO2 100 %. Body mass index is 37.75 kg/m. Physical Exam Vitals signs reviewed.  Constitutional:      Appearance: Normal appearance. She is obese.  Cardiovascular:     Rate and Rhythm: Normal rate.     Pulses:  Normal pulses.  Pulmonary:     Effort: Pulmonary effort is normal.     Breath sounds: Normal breath sounds.  Musculoskeletal: Normal range of motion.  Skin:    General: Skin is warm and dry.  Neurological:     Mental Status: She is alert and oriented to person, place, and time.  Psychiatric:        Behavior: Behavior normal.   RECENT LABS AND TESTS: BMET    Component Value Date/Time   NA 142 06/21/2019 1049   K 3.8 06/21/2019 1049   CL 100 06/21/2019 1049   CO2 25 06/21/2019 1049   GLUCOSE 110 (H) 06/21/2019 1049   BUN 13 06/21/2019 1049   CREATININE 0.67 06/21/2019 1049   CALCIUM 9.9 06/21/2019 1049   GFRNONAA 101 06/21/2019 1049   GFRAA 117 06/21/2019 1049   No results found for: HGBA1C No results found for: INSULIN CBC No results found for: WBC, RBC, HGB, HCT, PLT, MCV, MCH, MCHC, RDW, LYMPHSABS, MONOABS, EOSABS, BASOSABS Iron/TIBC/Ferritin/ %Sat No results  found for: IRON, TIBC, FERRITIN, IRONPCTSAT Lipid Panel  No results found for: CHOL, TRIG, HDL, CHOLHDL, VLDL, LDLCALC, LDLDIRECT Hepatic Function Panel     Component Value Date/Time   PROT 7.5 06/21/2019 1049   ALBUMIN 4.1 06/21/2019 1049   AST 23 06/21/2019 1049   ALT 33 (H) 06/21/2019 1049   ALKPHOS 91 06/21/2019 1049   BILITOT 0.6 06/21/2019 1049   No results found for: TSH  Results for LYNZIE, CLIBURN (MRN [id number]) as of 07/06/2019 08:30  Ref. Range 06/21/2019 10:49  Vitamin D, 25-Hydroxy Latest Ref Range: 30.0 - 100.0 ng/mL 50.3   OBESITY BEHAVIORAL INTERVENTION VISIT  Today's visit was #2   Starting weight: 241 lbs Starting date: 06/21/2019 Today's weight: 237 lbs Today's date: 07/05/2019 Total lbs lost to date: 4     07/05/2019  Height 5\' 7"  (1.702 m)  Weight 241 lb (109.3 kg)  BMI (Calculated) 37.74  BLOOD PRESSURE - SYSTOLIC 557  BLOOD PRESSURE - DIASTOLIC 78   Body Fat % 32.2 %  Total Body Water (lbs) 86.8 lbs   ASK: We discussed the diagnosis of obesity with Nakoma C Hulen Luster today and Trinidee agreed to give [name] permission to discuss obesity behavioral modification therapy today.  ASSESS: Sharman has the diagnosis of obesity and her BMI today is 37.2. Iolani is in the action stage of change.   ADVISE: Clementina was educated on the multiple health risks of obesity as well as the benefit of weight loss to improve her health. She was advised of the need for long term treatment and the importance of lifestyle modifications to improve her current health and to decrease her risk of future health problems.  AGREE: Multiple dietary modification options and treatment options were discussed and  Stefanie agreed to follow the recommendations documented in the above note.  ARRANGE: Caytlyn was educated on the importance of frequent visits to treat obesity as outlined per CMS and USPSTF guidelines and agreed to schedule her next follow up appointment  today.  Migdalia Dk, am acting as Location manager for [employer], DO   I have reviewed the above documentation for accuracy and completeness, and I agree with the above. -Jearld Lesch, DO

## 2019-07-19 ENCOUNTER — Other Ambulatory Visit: Payer: Self-pay

## 2019-07-19 ENCOUNTER — Encounter (INDEPENDENT_AMBULATORY_CARE_PROVIDER_SITE_OTHER): Payer: Self-pay | Admitting: Bariatrics

## 2019-07-19 ENCOUNTER — Ambulatory Visit (INDEPENDENT_AMBULATORY_CARE_PROVIDER_SITE_OTHER): Payer: 59 | Admitting: Bariatrics

## 2019-07-19 VITALS — BP 107/70 | HR 78 | Temp 98.5°F | Ht 67.0 in | Wt 231.0 lb

## 2019-07-19 DIAGNOSIS — M2550 Pain in unspecified joint: Secondary | ICD-10-CM | POA: Diagnosis not present

## 2019-07-19 DIAGNOSIS — I10 Essential (primary) hypertension: Secondary | ICD-10-CM | POA: Diagnosis not present

## 2019-07-19 DIAGNOSIS — K5909 Other constipation: Secondary | ICD-10-CM | POA: Diagnosis not present

## 2019-07-19 DIAGNOSIS — Z6836 Body mass index (BMI) 36.0-36.9, adult: Secondary | ICD-10-CM | POA: Diagnosis not present

## 2019-07-20 ENCOUNTER — Encounter (INDEPENDENT_AMBULATORY_CARE_PROVIDER_SITE_OTHER): Payer: Self-pay | Admitting: Bariatrics

## 2019-07-20 NOTE — Progress Notes (Signed)
Office: [phone]  /  Fax: [phone]   HPI:  Chief Complaint: OBESITY Tara Wood is here to discuss her progress with her obesity treatment plan. She is on the Category 3 plan + 100 calories and states she is following her eating plan approximately 95% of the time. She states she is exercising 0 minutes 0 times per week.  Tara Wood is down an additional 6 lbs from her last visit and doing well overall. She states that she has been doing well with the plan.  Today's visit was #3  Starting weight: 241 lbs Starting date: 06/21/2019 Today's weight: 231 lbs  Today's date: 07/19/2019 Total lbs lost to date: 10 Total lbs lost since last in-office visit: 6  Hypertension Tara Wood has a diagnosis of hypertension, which is well controlled. She is taking Aldactone. No lightheadedness.  Polyarthralgias Tara Wood has polyarthralgias and is on no medications.  Constipation Tara Wood has constipation, which is not chronic. She did have some retained stool, now resolved.  ASSESSMENT AND PLAN:  Essential hypertension  Other constipation  Polyarthralgia  Class 2 severe obesity with serious comorbidity and body mass index (BMI) of 36.0 to 36.9 in adult, unspecified obesity type ([place])  PLAN:  Hypertension Tara Wood is working on healthy weight loss and exercise to improve blood pressure control. She will continue her medications. We will watch for signs of hypotension as she continues her lifestyle modifications.  Polyarthralgias Tara Wood will follow-up with her PCP. She will not do any pounding exercises.  Constipation Tara Wood was informed decrease bowel movement frequency is normal while losing weight, but stools should not be hard or painful. She was advised to increase her H20 intake and work on optimizing her fiber intake. Tara Wood can take Senokot occasionally, will increase her water intake, will increase vegetables (raw vegetables), increase fiber, and take Citracal (increase water).  TIME  SPENT: 20 minutes   Obesity Tara Wood is currently in the action stage of change. As such, her goal is to continue with weight loss efforts. She has agreed to follow the Category 3 plan + 100 calories. Tara Wood will work on meal planning, intentional eating, and continue packing her lunch. Tara Wood has been instructed to work up to a goal of 150 minutes of combined cardio and strengthening exercise per week for weight loss and overall health benefits. We discussed the following Behavioral Modification Strategies today: increasing lean protein intake, decreasing simple carbohydrates, increasing vegetables, increase H20 intake, decrease eating out, no skipping meals, work on meal planning and easy cooking plans, and keeping healthy foods in the home.  Tara Wood has agreed to follow-up with our clinic in 2-3 weeks. She was informed of the importance of frequent follow-up visits to maximize her success with intensive lifestyle modifications for her multiple health conditions.  ALLERGIES: No Known Allergies  MEDICATIONS: Current Outpatient Medications on File Prior to Visit  Medication Sig Dispense Refill  . Cholecalciferol (VITAMIN D3) 125 MCG (5000 UT) TABS Take by mouth.    . cyanocobalamin 1000 MCG tablet Take 1,000 mcg by mouth daily.    . methylPREDNISolone (MEDROL DOSEPAK) 4 MG TBPK tablet Take by mouth daily - taper daily dose per package instructions. 21 tablet 0  . spironolactone (ALDACTONE) 25 MG tablet     . terbinafine (LAMISIL) 1 % cream Apply 1 application topically 2 (two) times daily.    . [DISCONTINUED] fexofenadine (ALLEGRA) 180 MG tablet Take 180 mg by mouth daily.     No current facility-administered medications on file prior to visit.  PAST MEDICAL HISTORY: Past Medical History:  Diagnosis Date  . B12 deficiency   . Borderline high blood pressure   . Chest pain   . Joint pain   . Obesity   . Palpitations   . PVC (premature ventricular contraction)   . Reactive airway  disease   . SOB (shortness of breath)   . Swallowing difficulty   . Swelling of right foot   . Vitamin D deficiency     PAST SURGICAL HISTORY: Past Surgical History:  Procedure Laterality Date  . TUBAL LIGATION      SOCIAL HISTORY: Social History   Tobacco Use  . Smoking status: Never Smoker  . Smokeless tobacco: Never Used  Substance Use Topics  . Alcohol use: Not Currently  . Drug use: Not Currently    FAMILY HISTORY: Family History  Problem Relation Age of Onset  . Heart failure Mother   . Parkinson's disease Mother   . Heart disease Mother   . Stroke Father   . High blood pressure Father    ROS: Review of Systems  Constitutional: Positive for weight loss.  Gastrointestinal: Positive for constipation (not chronic).  Musculoskeletal:       Positive for polyarthralgias.  Neurological:       Negative for lightheadedness.   PHYSICAL EXAM: Blood pressure 107/70, pulse 78, temperature 98.5 F (36.9 C), height 5\' 7"  (1.702 m), weight 231 lb (104.8 kg), SpO2 99 %. Body mass index is 36.18 kg/m. Physical Exam Vitals reviewed.  Constitutional:      Appearance: Normal appearance. She is obese.  Cardiovascular:     Rate and Rhythm: Normal rate.     Pulses: Normal pulses.  Pulmonary:     Effort: Pulmonary effort is normal.     Breath sounds: Normal breath sounds.  Musculoskeletal:        General: Normal range of motion.  Skin:    General: Skin is warm and dry.  Neurological:     Mental Status: She is alert and oriented to person, place, and time.  Psychiatric:        Behavior: Behavior normal.   RECENT LABS AND TESTS: BMET    Component Value Date/Time   NA 142 06/21/2019 1049   K 3.8 06/21/2019 1049   CL 100 06/21/2019 1049   CO2 25 06/21/2019 1049   GLUCOSE 110 (H) 06/21/2019 1049   BUN 13 06/21/2019 1049   CREATININE 0.67 06/21/2019 1049   CALCIUM 9.9 06/21/2019 1049   GFRNONAA 101 06/21/2019 1049   GFRAA 117 06/21/2019 1049   No results found  for: HGBA1C No results found for: INSULIN CBC No results found for: WBC, RBC, HGB, HCT, PLT, MCV, MCH, MCHC, RDW, LYMPHSABS, MONOABS, EOSABS, BASOSABS Iron/TIBC/Ferritin/ %Sat No results found for: IRON, TIBC, FERRITIN, IRONPCTSAT Lipid Panel  No results found for: CHOL, TRIG, HDL, CHOLHDL, VLDL, LDLCALC, LDLDIRECT Hepatic Function Panel     Component Value Date/Time   PROT 7.5 06/21/2019 1049   ALBUMIN 4.1 06/21/2019 1049   AST 23 06/21/2019 1049   ALT 33 (H) 06/21/2019 1049   ALKPHOS 91 06/21/2019 1049   BILITOT 0.6 06/21/2019 1049   No results found for: TSH  OBESITY BEHAVIORAL INTERVENTION VISIT DOCUMENTATION FOR INSURANCE (~15 minutes)  I, 06/23/2019, am acting as [place] [employer] for Energy manager, DO  I have reviewed the above documentation for accuracy and completeness, and I agree with the above. -[employer], DO

## 2019-08-10 ENCOUNTER — Encounter (INDEPENDENT_AMBULATORY_CARE_PROVIDER_SITE_OTHER): Payer: Self-pay | Admitting: Bariatrics

## 2019-08-10 ENCOUNTER — Ambulatory Visit (INDEPENDENT_AMBULATORY_CARE_PROVIDER_SITE_OTHER): Payer: 59 | Admitting: Bariatrics

## 2019-08-10 ENCOUNTER — Other Ambulatory Visit: Payer: Self-pay

## 2019-08-10 VITALS — BP 113/73 | HR 76 | Temp 98.3°F | Ht 67.0 in | Wt 228.0 lb

## 2019-08-10 DIAGNOSIS — Z6835 Body mass index (BMI) 35.0-35.9, adult: Secondary | ICD-10-CM | POA: Diagnosis not present

## 2019-08-10 DIAGNOSIS — K5909 Other constipation: Secondary | ICD-10-CM

## 2019-08-10 DIAGNOSIS — I10 Essential (primary) hypertension: Secondary | ICD-10-CM

## 2019-08-11 NOTE — Progress Notes (Signed)
Chief Complaint:   OBESITY Tara Wood is here to discuss her progress with her obesity treatment plan along with follow-up of her obesity related diagnoses. Tara Wood is on the Category 3 Plan + 100 calories and states she is following her eating plan approximately 80% of the time. Tara Wood states she is exercising 0 minutes 0 times per week.  Today's visit was #: 4 Starting weight: 241 lbs Starting date: 06/21/2019 Today's weight: 228 lbs Today's date: 08/10/2019 Total lbs lost to date: 13  Total lbs lost since last in-office visit: 3  Interim History: Tara Wood is down 3 lbs and is doing well overall. She was doing more snacking overall, but eating healthy snacks. She is up 2 lbs of water per the impedance scale.  Subjective:   Essential hypertension. Tara Wood takes spironolactone. This is well controlled. Blood pressure 113/73 today.  BP Readings from Last 3 Encounters:  08/10/19 113/73  07/19/19 107/70  07/05/19 115/78   Other constipation. Tara Wood notes constipation and is on no medication. She had to take a laxative and this has improved.  Assessment/Plan:   Essential hypertension. Tara Wood is working on healthy weight loss and exercise to improve blood pressure control. We will watch for signs of hypotension as she continues her lifestyle modifications. She will continue her medications.  Other constipation. Tara Wood was informed that a decrease in bowel movement frequency is normal while losing weight, but stools should not be hard or painful. Orders and follow up as documented in patient record. She will increase her water intake and will add in more raw vegetables.  Counseling Getting to Good Bowel Health: Your goal is to have one soft bowel movement each day. Drink at least 8 glasses of water each day. Eat plenty of fiber (goal is over 25 grams each day). It is best to get most of your fiber from dietary sources which includes leafy green vegetables, fresh fruit,  and whole grains. You may need to add fiber with the help of OTC fiber supplements. These include Metamucil, Citrucel, and Flaxseed. If you are still having trouble, try adding Miralax or Magnesium Citrate. If all of these changes do not work, Cabin crew.  Class 2 severe obesity with serious comorbidity and body mass index (BMI) of 35.0 to 35.9 in adult, unspecified obesity type ([place]).  Tara Wood is currently in the action stage of change. As such, her goal is to continue with weight loss efforts. She has agreed to the Category 3 Plan + 100 calories.  She will work on meal planning, intentional eating, will minimize "bad snacks", and will spread the protein.   We discussed the following exercise goals today: Tara Wood will consider buying a treadmill. She will walk on the weekends.  We discussed the following behavioral modification strategies today: increasing lean protein intake, decreasing simple carbohydrates, increasing vegetables, increasing water intake, decreasing eating out, no skipping meals, meal planning and cooking strategies, keeping healthy foods in the home and planning for success.  Tara Wood has agreed to follow-up with our clinic in 2 weeks. She was informed of the importance of frequent follow-up visits to maximize her success with intensive lifestyle modifications for her multiple health conditions.   Objective:   Blood pressure 113/73, pulse 76, temperature 98.3 F (36.8 C), height 5\' 7"  (1.702 m), weight 228 lb (103.4 kg), last menstrual period 08/02/2018, SpO2 99 %. Body mass index is 35.71 kg/m.  General: Cooperative, alert, well developed, in no acute distress.  HEENT: Conjunctivae and lids unremarkable. Neck: No thyromegaly.  Cardiovascular: Regular rhythm.  Lungs: Normal work of breathing. Extremities: No edema.  Neurologic: No focal deficits.   Lab Results  Component Value Date   CREATININE 0.67 06/21/2019   BUN 13 06/21/2019   NA  142 06/21/2019   K 3.8 06/21/2019   CL 100 06/21/2019   CO2 25 06/21/2019   Lab Results  Component Value Date   ALT 33 (H) 06/21/2019   AST 23 06/21/2019   ALKPHOS 91 06/21/2019   BILITOT 0.6 06/21/2019   No results found for: HGBA1C No results found for: INSULIN No results found for: TSH No results found for: CHOL, HDL, LDLCALC, LDLDIRECT, TRIG, CHOLHDL No results found for: WBC, HGB, HCT, MCV, PLT No results found for: IRON, TIBC, FERRITIN  Attestation Statements:   Reviewed by clinician on day of visit: allergies, medications, problem list, medical history, surgical history, family history, social history, and previous encounter notes.  Time spent on visit including pre-visit chart review and post-visit care was 22 minutes.   Fernanda Drum, am acting as Energy manager for [employer], DO   I have reviewed the above documentation for accuracy and completeness, and I agree with the above. Corinna Capra, DO

## 2019-08-24 ENCOUNTER — Ambulatory Visit (INDEPENDENT_AMBULATORY_CARE_PROVIDER_SITE_OTHER): Payer: 59 | Admitting: Family Medicine

## 2019-08-24 ENCOUNTER — Encounter (INDEPENDENT_AMBULATORY_CARE_PROVIDER_SITE_OTHER): Payer: Self-pay | Admitting: Family Medicine

## 2019-08-24 ENCOUNTER — Other Ambulatory Visit: Payer: Self-pay

## 2019-08-24 VITALS — BP 129/79 | HR 69 | Temp 98.7°F | Ht 67.0 in | Wt 225.0 lb

## 2019-08-24 DIAGNOSIS — Z6835 Body mass index (BMI) 35.0-35.9, adult: Secondary | ICD-10-CM

## 2019-08-24 DIAGNOSIS — E119 Type 2 diabetes mellitus without complications: Secondary | ICD-10-CM | POA: Diagnosis not present

## 2019-08-24 DIAGNOSIS — K5909 Other constipation: Secondary | ICD-10-CM | POA: Diagnosis not present

## 2019-08-25 NOTE — Progress Notes (Signed)
Chief Complaint:   OBESITY Tara Wood is here to discuss her progress with her obesity treatment plan along with follow-up of her obesity related diagnoses. Tara Wood is on the Category 3 Plan + 100 and states she is following her eating plan approximately 90% of the time. Tara Wood states she is exercising 0 minutes 0 times per week.  Today's visit was #: 5 Starting weight: 241 lbs Starting date: 06/21/2019 Today's weight: 225 lbs Today's date: 08/24/2019 Total lbs lost to date: 16 Total lbs lost since last in-office visit: 3  Interim History: Tara Wood has done well over the past two months on the plan. She sometimes goes over 400 on her snack calories. Tara Wood occasionally skips meals.  Subjective:   Type 2 diabetes mellitus without complication, without long-term current use of insulin (HCC) Tara Wood A1c was 6.5 on 06/07/19. She is not on metformin. She is hoping her A1c will be lower at the next check. She has never had a diagnosis of diabetes in the past. Labs were discussed with patient today.  No results found for: HGBA1C Lab Results  Component Value Date   CREATININE 0.67 06/21/2019   No results found for: INSULIN  Other constipation Tara Wood notes constipation has improved since the last office visit. She has used some Senna as needed. Tara Wood admits to blood in her stool one time over two months. She thinks she has been diagnosed with hemorrhoids in the past when she had blood in her stool.  Assessment/Plan:   Type 2 diabetes mellitus without complication, without long-term current use of insulin (HCC) Good blood sugar control is important to decrease the likelihood of diabetic complications such as nephropathy, neuropathy, limb loss, blindness, coronary artery disease, and death. Intensive lifestyle modification including diet, exercise and weight loss are the first line of treatment for diabetes. We will check A1c at the next visit.  Other  constipation Day was informed that a decrease in bowel movement frequency is normal while losing weight, but stools should not be hard or painful. Tara Wood is eating all of the veggies on the plan and she is drinking a good amount of water. She will take a stool softener daily. Advised her to follow up with PCP if she continues to see blood in her stool.    Class 2 severe obesity with serious comorbidity and body mass index (BMI) of 35.0 to 35.9 in adult, unspecified obesity type (HCC) Tara Wood is currently in the action stage of change. As such, her goal is to continue with weight loss efforts. She has agreed to the Category 3 Plan +100 calories.   Exercise goals: Tara Wood will walk for 15 to 20 minutes, 2 to 3 times per week.  Behavioral modification strategies: decreasing simple carbohydrates, no skipping meals and planning for success.  Tara Wood has agreed to follow-up with our clinic in 2 weeks. She was informed of the importance of frequent follow-up visits to maximize her success with intensive lifestyle modifications for her multiple health conditions.   Objective:   Blood pressure 129/79, pulse 69, temperature 98.7 F (37.1 C), temperature source Oral, height 5\' 7"  (1.702 m), weight 225 lb (102.1 kg), SpO2 98 %. Body mass index is 35.24 kg/m.  General: Cooperative, alert, well developed, in no acute distress. HEENT: Conjunctivae and lids unremarkable. Cardiovascular: Regular rhythm.  Lungs: Normal work of breathing. Neurologic: No focal deficits.   Lab Results  Component Value Date   CREATININE 0.67 06/21/2019   BUN 13 06/21/2019  NA 142 06/21/2019   K 3.8 06/21/2019   CL 100 06/21/2019   CO2 25 06/21/2019   Lab Results  Component Value Date   ALT 33 (H) 06/21/2019   AST 23 06/21/2019   ALKPHOS 91 06/21/2019   BILITOT 0.6 06/21/2019   No results found for: HGBA1C No results found for: INSULIN No results found for: TSH No results found for: CHOL, HDL, LDLCALC,  LDLDIRECT, TRIG, CHOLHDL No results found for: WBC, HGB, HCT, MCV, PLT No results found for: IRON, TIBC, FERRITIN   Ref. Range 06/21/2019 10:49  Vitamin D, 25-Hydroxy Latest Ref Range: 30.0 - 100.0 ng/mL 50.3    Attestation Statements:   Reviewed by clinician on day of visit: allergies, medications, problem list, medical history, surgical history, family history, social history, and previous encounter notes.  Corey Skains, am acting as Location manager for [employer], FNP-C.  I have reviewed the above documentation for accuracy and completeness, and I agree with the above. -  Deegan Valentino [employer], FNP-C

## 2019-08-26 ENCOUNTER — Encounter (INDEPENDENT_AMBULATORY_CARE_PROVIDER_SITE_OTHER): Payer: Self-pay | Admitting: Family Medicine

## 2019-08-26 DIAGNOSIS — E119 Type 2 diabetes mellitus without complications: Secondary | ICD-10-CM | POA: Insufficient documentation

## 2019-09-08 ENCOUNTER — Ambulatory Visit (INDEPENDENT_AMBULATORY_CARE_PROVIDER_SITE_OTHER): Payer: 59 | Admitting: Family Medicine

## 2019-09-09 ENCOUNTER — Encounter (INDEPENDENT_AMBULATORY_CARE_PROVIDER_SITE_OTHER): Payer: Self-pay | Admitting: Bariatrics

## 2019-09-09 ENCOUNTER — Other Ambulatory Visit: Payer: Self-pay

## 2019-09-09 ENCOUNTER — Ambulatory Visit (INDEPENDENT_AMBULATORY_CARE_PROVIDER_SITE_OTHER): Payer: 59 | Admitting: Bariatrics

## 2019-09-09 VITALS — BP 116/78 | HR 71 | Temp 97.9°F | Ht 67.0 in | Wt 223.0 lb

## 2019-09-09 DIAGNOSIS — E538 Deficiency of other specified B group vitamins: Secondary | ICD-10-CM | POA: Diagnosis not present

## 2019-09-09 DIAGNOSIS — Z6835 Body mass index (BMI) 35.0-35.9, adult: Secondary | ICD-10-CM

## 2019-09-09 DIAGNOSIS — R6889 Other general symptoms and signs: Secondary | ICD-10-CM | POA: Diagnosis not present

## 2019-09-09 NOTE — Progress Notes (Signed)
Chief Complaint:   OBESITY Tara Wood is here to discuss her progress with her obesity treatment plan along with follow-up of her obesity related diagnoses. [name] is on the Category 3 Plan + 100 calories and states she is following her eating plan approximately 85% of the time. [name] states she is exercising 0 minutes 0 times per week.  Today's visit was #: 6 Starting weight: 241 lbs Starting date: 06/21/2019 Today's weight: 223 lbs Today's date: 09/09/2019 Total lbs lost to date: 18  Total lbs lost since last in-office visit: 2  Interim History: Tara Wood is down an additional 2 lbs and doing well overall. She reports getting in more water.  Subjective:   B12 nutritional deficiency. Tara Wood is taking B12 supplementation over-the-counter.  Lab Results  Component Value Date   VITAMINB12 408 06/21/2019   Hypomagnesemia. Tara Wood is taking magnesium. She reports occasional muscle cramps.  At risk for Exercise intolerance. Tara Wood is at risk of exercise intolerance due to knee pain.  Assessment/Plan:   B12 nutritional deficiency. The diagnosis was reviewed with the patient. Counseling provided today, see below. We will continue to monitor. Orders and follow up as documented in patient record. Hemoglobin A1c, Insulin, random, T3, T4, free, TSH, Vitamin B12, VITAMIN D 25 Hydroxy (Vit-D Deficiency, Fractures), Comprehensive metabolic panel labs ordered.  Counseling . The body needs vitamin B12: to make red blood cells; to make DNA; and to help the nerves work properly so they can carry messages from the brain to the body.  . The main causes of vitamin B12 deficiency include dietary deficiency, digestive diseases, pernicious anemia, and having a surgery in which part of the stomach or small intestine is removed.  . Certain medicines can make it harder for the body to absorb vitamin B12. These medicines include: heartburn medications; some antibiotics; some medications  used to treat diabetes, gout, and high cholesterol.  . In some cases, there are no symptoms of this condition. If the condition leads to anemia or nerve damage, various symptoms can occur, such as weakness or fatigue, shortness of breath, and numbness or tingling in your hands and feet.   . Treatment:  o May include taking vitamin B12 supplements.  o Avoid alcohol.  o Eat lots of healthy foods that contain vitamin B12: - Beef, pork, chicken, [place], and organ meats, such as liver.  - Seafood: This includes clams, rainbow trout, salmon, tuna, and haddock.  - Eggs.  - Cereal and dairy products that are fortified: This means that vitamin B12 has been added to the food.     Hypomagnesemia. Magnesium level ordered. She will increase her fluid intake.  At risk for Exercise intolerance. Tara Wood was given approximately 15 minutes of exercise intolerance counseling today. She is 54 y.o. female and has risk factors exercise intolerance including obesity. We discussed intensive lifestyle modifications today with an emphasis on specific weight loss instructions and strategies. Tara Wood will slowly increase activity as tolerated.  Repetitive spaced learning was employed today to elicit superior memory formation and behavioral change.  Class 2 severe obesity with serious comorbidity and body mass index (BMI) of 35.0 to 35.9 in adult, unspecified obesity type (HCC)  Tara Wood is currently in the action stage of change. As such, her goal is to continue with weight loss efforts. She has agreed to the Category 3 Plan + 100 calories.  She will work on meal planning, intentional eating, and decreasing her salt intake.   Exercise goals: Tara Wood will start  to incorporate more exercise.  Behavioral modification strategies: increasing lean protein intake, decreasing simple carbohydrates, increasing vegetables, increasing water intake, decreasing eating out, no skipping meals, meal planning and cooking strategies and  keeping healthy foods in the home.  Tara Wood has agreed to follow-up with our clinic in 2 weeks. She was informed of the importance of frequent follow-up visits to maximize her success with intensive lifestyle modifications for her multiple health conditions.   [name] was informed we would discuss her lab results at her next visit unless there is a critical issue that needs to be addressed sooner. Tara Wood agreed to keep her next visit at the agreed upon time to discuss these results.  Objective:   Blood pressure 116/78, pulse 71, temperature 97.9 F (36.6 C), height 5\' 7"  (1.702 m), weight 223 lb (101.2 kg), last menstrual period 08/03/2019, SpO2 98 %. Body mass index is 34.93 kg/m.  General: Cooperative, alert, well developed, in no acute distress. HEENT: Conjunctivae and lids unremarkable. Cardiovascular: Regular rhythm.  Lungs: Normal work of breathing. Neurologic: No focal deficits.   Lab Results  Component Value Date   CREATININE 0.67 06/21/2019   BUN 13 06/21/2019   NA 142 06/21/2019   K 3.8 06/21/2019   CL 100 06/21/2019   CO2 25 06/21/2019   Lab Results  Component Value Date   ALT 33 (H) 06/21/2019   AST 23 06/21/2019   ALKPHOS 91 06/21/2019   BILITOT 0.6 06/21/2019   No results found for: HGBA1C No results found for: INSULIN No results found for: TSH No results found for: CHOL, HDL, LDLCALC, LDLDIRECT, TRIG, CHOLHDL No results found for: WBC, HGB, HCT, MCV, PLT No results found for: IRON, TIBC, FERRITIN  Attestation Statements:   Reviewed by clinician on day of visit: allergies, medications, problem list, medical history, surgical history, family history, social history, and previous encounter notes.  Migdalia Dk, am acting as Location manager for [employer], DO   I have reviewed the above documentation for accuracy and completeness, and I agree with the above. Jearld Lesch, DO

## 2019-09-10 LAB — COMPREHENSIVE METABOLIC PANEL
ALT: 28 IU/L (ref 0–32)
AST: 21 IU/L (ref 0–40)
Albumin/Globulin Ratio: 1.5 (ref 1.2–2.2)
Albumin: 4.5 g/dL (ref 3.8–4.9)
Alkaline Phosphatase: 93 IU/L (ref 39–117)
BUN/Creatinine Ratio: 22 (ref 9–23)
BUN: 19 mg/dL (ref 6–24)
Bilirubin Total: 0.6 mg/dL (ref 0.0–1.2)
CO2: 26 mmol/L (ref 20–29)
Calcium: 9.7 mg/dL (ref 8.7–10.2)
Chloride: 101 mmol/L (ref 96–106)
Creatinine, Ser: 0.88 mg/dL (ref 0.57–1.00)
GFR calc Af Amer: 87 mL/min/{1.73_m2} (ref >59)
GFR calc non Af Amer: 75 mL/min/{1.73_m2} (ref >59)
Globulin, Total: 3 g/dL (ref 1.5–4.5)
Glucose: 86 mg/dL (ref 65–99)
Potassium: 4.5 mmol/L (ref 3.5–5.2)
Sodium: 142 mmol/L (ref 134–144)
Total Protein: 7.5 g/dL (ref 6.0–8.5)

## 2019-09-10 LAB — LIPID PANEL WITH LDL/HDL RATIO
Cholesterol, Total: 167 mg/dL (ref 100–199)
HDL: 43 mg/dL (ref >39)
LDL Chol Calc (NIH): 107 mg/dL — ABNORMAL HIGH (ref 0–99)
LDL/HDL Ratio: 2.5 ratio (ref 0.0–3.2)
Triglycerides: 90 mg/dL (ref 0–149)
VLDL Cholesterol Cal: 17 mg/dL (ref 5–40)

## 2019-09-10 LAB — T3: T3, Total: 131 ng/dL (ref 71–180)

## 2019-09-10 LAB — HEMOGLOBIN A1C
Est. average glucose Bld gHb Est-mCnc: 120 mg/dL
Hgb A1c MFr Bld: 5.8 % — ABNORMAL HIGH (ref 4.8–5.6)

## 2019-09-10 LAB — VITAMIN D 25 HYDROXY (VIT D DEFICIENCY, FRACTURES): Vit D, 25-Hydroxy: 78 ng/mL (ref 30.0–100.0)

## 2019-09-10 LAB — MAGNESIUM: Magnesium: 2.1 mg/dL (ref 1.6–2.3)

## 2019-09-10 LAB — T4, FREE: Free T4: 1.12 ng/dL (ref 0.82–1.77)

## 2019-09-10 LAB — VITAMIN B12: Vitamin B-12: 785 pg/mL (ref 232–1245)

## 2019-09-10 LAB — INSULIN, RANDOM: INSULIN: 11.8 u[IU]/mL (ref 2.6–24.9)

## 2019-09-10 LAB — TSH: TSH: 1.7 u[IU]/mL (ref 0.450–4.500)

## 2019-09-23 ENCOUNTER — Encounter (INDEPENDENT_AMBULATORY_CARE_PROVIDER_SITE_OTHER): Payer: Self-pay | Admitting: Bariatrics

## 2019-09-23 ENCOUNTER — Other Ambulatory Visit: Payer: Self-pay

## 2019-09-23 ENCOUNTER — Ambulatory Visit (INDEPENDENT_AMBULATORY_CARE_PROVIDER_SITE_OTHER): Payer: 59 | Admitting: Bariatrics

## 2019-09-23 VITALS — BP 115/74 | HR 67 | Temp 98.2°F | Ht 67.0 in | Wt 224.0 lb

## 2019-09-23 DIAGNOSIS — F3289 Other specified depressive episodes: Secondary | ICD-10-CM | POA: Diagnosis not present

## 2019-09-23 DIAGNOSIS — Z6835 Body mass index (BMI) 35.0-35.9, adult: Secondary | ICD-10-CM | POA: Diagnosis not present

## 2019-09-23 DIAGNOSIS — E559 Vitamin D deficiency, unspecified: Secondary | ICD-10-CM

## 2019-09-23 DIAGNOSIS — R7303 Prediabetes: Secondary | ICD-10-CM | POA: Diagnosis not present

## 2019-09-23 DIAGNOSIS — R69 Illness, unspecified: Secondary | ICD-10-CM | POA: Diagnosis not present

## 2019-09-23 MED ORDER — BUPROPION HCL ER (SR) 150 MG PO TB12: 150.0000 mg | ORAL_TABLET | Freq: Every day | ORAL | 0 refills | Status: DC

## 2019-09-23 NOTE — Progress Notes (Signed)
Chief Complaint:   OBESITY Tara Wood is here to discuss her progress with her obesity treatment plan along with follow-up of her obesity related diagnoses. Tara Wood is on the Category 3 Plan and states she is following her eating plan approximately 85% of the time. Tara Wood states she is walking 20 minutes 1-2 times per week.  Today's visit was #: 7 Starting weight: 241 lbs Starting date: 06/21/2019 Today's weight: 224 lbs Today's date: 09/23/2019 Total lbs lost to date: 17 Total lbs lost since last in-office visit: 0  Interim History: Tara Wood is up 1 lb. She states that she has been snacking, but no excessive hunger. She is up 2 lbs of water.  Subjective:   Prediabetes. Tara Wood has a diagnosis of prediabetes based on her elevated HgA1c and was informed this puts her at greater risk of developing diabetes. She continues to work on diet and exercise to decrease her risk of diabetes. She denies nausea or hypoglycemia. She is on no medications.  Lab Results  Component Value Date   HGBA1C 5.8 (H) 09/09/2019   Lab Results  Component Value Date   INSULIN 11.8 09/09/2019   Vitamin D deficiency. Tara Wood is taking Vitamin D OTC. Last Vitamin D 78.0 on 09/09/2019.  Other depression, with emotional eating. Tara Wood is struggling with emotional eating and using food for comfort to the extent that it is negatively impacting her health. She has been working on behavior modification techniques to help reduce her emotional eating and has been somewhat successful. She shows no sign of suicidal or homicidal ideations. Tara Wood reports stress eating.  Assessment/Plan:   Prediabetes. Tara Wood will continue to work on weight loss, exercise, increasing healthy fats and protein, and decreasing simple carbohydrates to help decrease the risk of diabetes.   Vitamin D deficiency. Low Vitamin D level contributes to fatigue and are associated with obesity, breast, and colon cancer. She agrees to  continue to take OTC Vitamin D @ 5,000 IU and will follow-up for routine testing of Vitamin D, at least 2-3 times per year to avoid over-replacement.  Other depression, with emotional eating. Behavior modification techniques were discussed today to help Tara Wood deal with her emotional/non-hunger eating behaviors.  Orders and follow up as documented in patient record. Tara Wood was given a prescription for buPROPion (WELLBUTRIN SR) 150 MG 12 hr tablet 1 PO daily #30 with 0 refills.  Class 2 severe obesity with serious comorbidity and body mass index (BMI) of 35.0 to 35.9 in adult, unspecified obesity type (HCC).  Tara Wood is currently in the action stage of change. As such, her goal is to continue with weight loss efforts. She has agreed to the Category 3 Plan + 100 calories.   She will work on meal planning.  We independently reviewed labs with the patient including CMP, lipids, Vitamin D, A1c, insulin, and thyroid panel.  Exercise goals: All adults should avoid inactivity. Some physical activity is better than none, and adults who participate in any amount of physical activity gain some health benefits.  Behavioral modification strategies: increasing lean protein intake, decreasing simple carbohydrates, increasing vegetables, increasing water intake, decreasing eating out, no skipping meals, meal planning and cooking strategies, keeping healthy foods in the home and planning for success.  Tara Wood has agreed to follow-up with our clinic in 2 weeks. She was informed of the importance of frequent follow-up visits to maximize her success with intensive lifestyle modifications for her multiple health conditions.   Objective:   Blood pressure 115/74,  pulse 67, temperature 98.2 F (36.8 C), temperature source Oral, height 5\' 7"  (1.702 m), weight 224 lb (101.6 kg), SpO2 99 %. Body mass index is 35.08 kg/m.  General: Cooperative, alert, well developed, in no acute distress. HEENT: Conjunctivae and lids  unremarkable. Cardiovascular: Regular rhythm.  Lungs: Normal work of breathing. Neurologic: No focal deficits.   Lab Results  Component Value Date   CREATININE 0.88 09/09/2019   BUN 19 09/09/2019   NA 142 09/09/2019   K 4.5 09/09/2019   CL 101 09/09/2019   CO2 26 09/09/2019   Lab Results  Component Value Date   ALT 28 09/09/2019   AST 21 09/09/2019   ALKPHOS 93 09/09/2019   BILITOT 0.6 09/09/2019   Lab Results  Component Value Date   HGBA1C 5.8 (H) 09/09/2019   Lab Results  Component Value Date   INSULIN 11.8 09/09/2019   Lab Results  Component Value Date   TSH 1.700 09/09/2019   Lab Results  Component Value Date   CHOL 167 09/09/2019   HDL 43 09/09/2019   LDLCALC 107 (H) 09/09/2019   TRIG 90 09/09/2019   No results found for: WBC, HGB, HCT, MCV, PLT No results found for: IRON, TIBC, FERRITIN  Attestation Statements:   Reviewed by clinician on day of visit: allergies, medications, problem list, medical history, surgical history, family history, social history, and previous encounter notes.  Migdalia Dk, am acting as Location manager for [employer], DO   I have reviewed the above documentation for accuracy and completeness, and I agree with the above. Jearld Lesch, DO

## 2019-10-14 ENCOUNTER — Encounter (INDEPENDENT_AMBULATORY_CARE_PROVIDER_SITE_OTHER): Payer: Self-pay | Admitting: Bariatrics

## 2019-10-14 ENCOUNTER — Ambulatory Visit (INDEPENDENT_AMBULATORY_CARE_PROVIDER_SITE_OTHER): Payer: BC Managed Care – PPO | Admitting: Bariatrics

## 2019-10-14 ENCOUNTER — Other Ambulatory Visit: Payer: Self-pay

## 2019-10-14 VITALS — BP 118/72 | HR 76 | Temp 98.6°F | Ht 67.0 in | Wt 221.0 lb

## 2019-10-14 DIAGNOSIS — Z6834 Body mass index (BMI) 34.0-34.9, adult: Secondary | ICD-10-CM

## 2019-10-14 DIAGNOSIS — R7303 Prediabetes: Secondary | ICD-10-CM

## 2019-10-14 DIAGNOSIS — I10 Essential (primary) hypertension: Secondary | ICD-10-CM | POA: Diagnosis not present

## 2019-10-14 DIAGNOSIS — M79671 Pain in right foot: Secondary | ICD-10-CM | POA: Diagnosis not present

## 2019-10-14 DIAGNOSIS — Z9189 Other specified personal risk factors, not elsewhere classified: Secondary | ICD-10-CM | POA: Diagnosis not present

## 2019-10-14 DIAGNOSIS — E669 Obesity, unspecified: Secondary | ICD-10-CM

## 2019-10-14 NOTE — Progress Notes (Signed)
Chief Complaint:   OBESITY Tara Wood is here to discuss her progress with her obesity treatment plan along with follow-up of her obesity related diagnoses. Tara Wood is on the Category 3 Plan and states she is following her eating plan approximately 90% of the time. Tara Wood states she is walking 10 minutes 2-3 times per week.  Today's visit was #: 8 Starting weight: 241 lbs Starting date: 06/21/2019 Today's weight: 221 lbs Today's date: 10/14/2019 Total lbs lost to date: 20 Total lbs lost since last in-office visit: 3  Interim History: Tara Wood is down 3 lbs. She feels like she is retaining water. She reports being more salt sensitive. She states her smell and taste remain decreased since having COVID-19.  Subjective:   Prediabetes. Tara Wood has a diagnosis of prediabetes based on her elevated HgA1c and was informed this puts her at greater risk of developing diabetes. She continues to work on diet and exercise to decrease her risk of diabetes. She denies nausea or hypoglycemia. Tara Wood is on no medications.  Lab Results  Component Value Date   HGBA1C 5.8 (H) 09/09/2019   Lab Results  Component Value Date   INSULIN 11.8 09/09/2019   Essential hypertension. Tara Wood is taking aldactone. Blood pressure is controlled, but she is on a low dose, and having some extra fluid retention.   BP Readings from Last 3 Encounters:  10/14/19 118/72  09/23/19 115/74  09/09/19 116/78   Lab Results  Component Value Date   CREATININE 0.88 09/09/2019   CREATININE 0.67 06/21/2019   Right foot pain (5th digit). Tara Wood has had right foot pain for 3 months. Pain has decreased her ability to exercise. She had x-ray performed initially.  At risk for heart disease. Tara Wood is at a higher than average risk for cardiovascular disease secondary to obesity and hypertension. Reviewed: no chest pain on exertion, no dyspnea on exertion, and no swelling of ankles.  Assessment/Plan:    Prediabetes. Tara Wood will continue to work on weight loss, exercise, increasing healthy fats and protein, and decreasing simple carbohydrates to help decrease the risk of diabetes.   Essential hypertension. Tara Wood is working on healthy weight loss and exercise to improve blood pressure control. We will watch for signs of hypotension as she continues her lifestyle modifications. She will continue her medication as directed.She will increase her Spironolactone from 25 to 50 mg.  She was given a prescription for spironolactone 50 mg 1 PO daily #30 with 0 refills.  Right foot pain (5th digit). Tara Wood will see her podiatrist.  At risk for heart disease. Tara Wood was given approximately 15 minutes of coronary artery disease prevention counseling today. She is 54 y.o. female and has risk factors for heart disease including obesity. We discussed intensive lifestyle modifications today with an emphasis on specific weight loss instructions and strategies.   Repetitive spaced learning was employed today to elicit superior memory formation and behavioral change.  Class 1 obesity with serious comorbidity and body mass index (BMI) of 34.0 to 34.9 in adult, unspecified obesity type.  Tara Wood is currently in the action stage of change. As such, her goal is to continue with weight loss efforts. She has agreed to the Category 3 Plan.   She will work on meal planning, mindful eating, decreasing her salt intake, and increasing her water intake.  Exercise goals: All adults should avoid inactivity. Some physical activity is better than none, and adults who participate in any amount of physical activity gain some health benefits.  Behavioral modification strategies: increasing lean protein intake, decreasing simple carbohydrates, increasing vegetables, increasing water intake, decreasing eating out, no skipping meals, meal planning and cooking strategies, keeping healthy foods in the home and planning for  success.  Tara Wood has agreed to follow-up with our clinic in 2 weeks. She was informed of the importance of frequent follow-up visits to maximize her success with intensive lifestyle modifications for her multiple health conditions.   Objective:   Blood pressure 118/72, pulse 76, temperature 98.6 F (37 C), height 5\' 7"  (1.702 m), weight 221 lb (100.2 kg), SpO2 99 %. Body mass index is 34.61 kg/m.  General: Cooperative, alert, well developed, in no acute distress. HEENT: Conjunctivae and lids unremarkable. Cardiovascular: Regular rhythm.  Lungs: Normal work of breathing. Neurologic: No focal deficits.   Lab Results  Component Value Date   CREATININE 0.88 09/09/2019   BUN 19 09/09/2019   NA 142 09/09/2019   K 4.5 09/09/2019   CL 101 09/09/2019   CO2 26 09/09/2019   Lab Results  Component Value Date   ALT 28 09/09/2019   AST 21 09/09/2019   ALKPHOS 93 09/09/2019   BILITOT 0.6 09/09/2019   Lab Results  Component Value Date   HGBA1C 5.8 (H) 09/09/2019   Lab Results  Component Value Date   INSULIN 11.8 09/09/2019   Lab Results  Component Value Date   TSH 1.700 09/09/2019   Lab Results  Component Value Date   CHOL 167 09/09/2019   HDL 43 09/09/2019   LDLCALC 107 (H) 09/09/2019   TRIG 90 09/09/2019   No results found for: WBC, HGB, HCT, MCV, PLT No results found for: IRON, TIBC, FERRITIN  Attestation Statements:   Reviewed by clinician on day of visit: allergies, medications, problem list, medical history, surgical history, family history, social history, and previous encounter notes.  11/07/2019, am acting as Tara Wood for Energy manager, DO   I have reviewed the above documentation for accuracy and completeness, and I agree with the above. [employer], DO

## 2019-10-24 ENCOUNTER — Ambulatory Visit: Payer: Self-pay | Attending: Internal Medicine

## 2019-10-24 DIAGNOSIS — Z23 Encounter for immunization: Secondary | ICD-10-CM

## 2019-10-24 NOTE — Progress Notes (Signed)
   Covid-19 Vaccination Clinic  Name:  Carollynn Pennywell    MRN: [id number] DOB: 1966-01-10  10/24/2019  Ms. Antigone Crowell was observed post Covid-19 immunization for 15 minutes without incident. She was provided with Vaccine Information Sheet and instruction to access the V-Safe system.   Ms. Kineta Fudala was instructed to call 911 with any severe reactions post vaccine: Marland Kitchen Difficulty breathing  . Swelling of face and throat  . A fast heartbeat  . A bad rash all over body  . Dizziness and weakness   Immunizations Administered    Name Date Dose VIS Date Route   Pfizer COVID-19 Vaccine 10/24/2019  5:28 PM 0.3 mL 07/09/2019 Intramuscular   Manufacturer: [employer], [employer]   Lot: [id number]   NDC: [address]-6

## 2019-11-04 ENCOUNTER — Ambulatory Visit (INDEPENDENT_AMBULATORY_CARE_PROVIDER_SITE_OTHER): Payer: Self-pay | Admitting: Bariatrics

## 2019-11-14 ENCOUNTER — Ambulatory Visit: Payer: Self-pay | Attending: Internal Medicine

## 2019-11-14 DIAGNOSIS — Z23 Encounter for immunization: Secondary | ICD-10-CM

## 2019-11-14 NOTE — Progress Notes (Signed)
   Covid-19 Vaccination Clinic  Name:  Tara Wood    MRN: [id number] DOB: 07/15/1966  11/14/2019  Ms. Tara Wood was observed post Covid-19 immunization for 15 minutes without incident. She was provided with Vaccine Information Sheet and instruction to access the V-Safe system.   Ms. Tara Wood was instructed to call 911 with any severe reactions post vaccine: Marland Kitchen Difficulty breathing  . Swelling of face and throat  . A fast heartbeat  . A bad rash all over body  . Dizziness and weakness   Immunizations Administered    Name Date Dose VIS Date Route   Pfizer COVID-19 Vaccine 11/14/2019  5:26 PM 0.3 mL 09/22/2018 Intramuscular   Manufacturer: [employer], [employer]   Lot: [id number]   NDC: [address]-9

## 2019-11-25 ENCOUNTER — Encounter (INDEPENDENT_AMBULATORY_CARE_PROVIDER_SITE_OTHER): Payer: Self-pay | Admitting: Bariatrics

## 2019-11-30 ENCOUNTER — Ambulatory Visit (INDEPENDENT_AMBULATORY_CARE_PROVIDER_SITE_OTHER): Payer: BC Managed Care – PPO | Admitting: Bariatrics

## 2019-11-30 ENCOUNTER — Other Ambulatory Visit: Payer: Self-pay

## 2019-11-30 ENCOUNTER — Encounter (INDEPENDENT_AMBULATORY_CARE_PROVIDER_SITE_OTHER): Payer: Self-pay | Admitting: Bariatrics

## 2019-11-30 VITALS — BP 125/81 | HR 73 | Temp 98.1°F | Ht 67.0 in | Wt 223.0 lb

## 2019-11-30 DIAGNOSIS — R7303 Prediabetes: Secondary | ICD-10-CM

## 2019-11-30 DIAGNOSIS — Z9189 Other specified personal risk factors, not elsewhere classified: Secondary | ICD-10-CM | POA: Diagnosis not present

## 2019-11-30 DIAGNOSIS — I10 Essential (primary) hypertension: Secondary | ICD-10-CM | POA: Diagnosis not present

## 2019-11-30 DIAGNOSIS — Z6835 Body mass index (BMI) 35.0-35.9, adult: Secondary | ICD-10-CM

## 2019-11-30 MED ORDER — SPIRONOLACTONE 25 MG PO TABS: 25.0000 mg | ORAL_TABLET | Freq: Every day | ORAL | 0 refills | Status: AC

## 2019-11-30 NOTE — Progress Notes (Signed)
Chief Complaint:   OBESITY Tara Wood is here to discuss her progress with her obesity treatment plan along with follow-up of her obesity related diagnoses. Tara Wood is on the Category 3 Plan and states she is following her eating plan approximately 50% of the time. Tara Wood states she is walking 1 mile 3 times per week.  Today's visit was #: 9 Starting weight: 241 lbs Starting date: 06/21/2019 Today's weight: 223 lbs Today's date: 11/30/2019 Total lbs lost to date: 18 Total lbs lost since last in-office visit: 0  Interim History: Tara Wood was last seen in the clinic 10/14/2019. She reports having been under more stress and her weight is up 2 lbs.  Subjective:   Essential hypertension. Nikcole reports having been on spironolactone.  BP Readings from Last 3 Encounters:  11/30/19 125/81  10/14/19 118/72  09/23/19 115/74   Lab Results  Component Value Date   CREATININE 0.88 09/09/2019   CREATININE 0.67 06/21/2019   Prediabetes. Tara Wood has a diagnosis of prediabetes based on her elevated HgA1c and was informed this puts her at greater risk of developing diabetes. She continues to work on diet and exercise to decrease her risk of diabetes. She denies nausea or hypoglycemia. Tara Wood is on no medications.  Lab Results  Component Value Date   HGBA1C 5.8 (H) 09/09/2019   Lab Results  Component Value Date   INSULIN 11.8 09/09/2019   At risk for heart disease. [name] is at a higher than average risk for cardiovascular disease due to hypertension.   Assessment/Plan:   Essential hypertension. Syliva is working on healthy weight loss and exercise to improve blood pressure control. We will watch for signs of hypotension as she continues her lifestyle modifications. Prescription was given for spironolactone (ALDACTONE) 25 MG tablet 1 PO daily #30 with 0 refills.  Prediabetes. Tara Wood will continue to work on weight loss, exercise, increasing healthy fats and protein, and  decreasing simple carbohydrates to help decrease the risk of diabetes.   At risk for heart disease. Tara Wood was given approximately 15 minutes of coronary artery disease prevention counseling today. She is 54 y.o. female and has risk factors for heart disease including obesity. We discussed intensive lifestyle modifications today with an emphasis on specific weight loss instructions and strategies.   Repetitive spaced learning was employed today to elicit superior memory formation and behavioral change.  Class 2 severe obesity with serious comorbidity and body mass index (BMI) of 35.0 to 35.9 in adult, unspecified obesity type ([place]).  Tara Wood is currently in the action stage of change. As such, her goal is to continue with weight loss efforts. She has agreed to the Category 3 Plan alternating with the Pescatarian plan + 200 calorie snacks.   She will work on meal planning, intentional eating, and decreasing skipping meals.  Exercise goals: Tara Wood will continue to walk.  Behavioral modification strategies: increasing lean protein intake, decreasing simple carbohydrates, increasing vegetables, increasing water intake, decreasing eating out, no skipping meals, meal planning and cooking strategies, keeping healthy foods in the home and planning for success.  Tara Wood has agreed to follow-up with our clinic in 2 weeks. She was informed of the importance of frequent follow-up visits to maximize her success with intensive lifestyle modifications for her multiple health conditions.   Objective:   Blood pressure 125/81, pulse 73, temperature 98.1 F (36.7 C), height 5\' 7"  (1.702 m), weight 223 lb (101.2 kg), SpO2 98 %. Body mass index is 34.93 kg/m.  General: Cooperative,  alert, well developed, in no acute distress. HEENT: Conjunctivae and lids unremarkable. Cardiovascular: Regular rhythm.  Lungs: Normal work of breathing. Neurologic: No focal deficits.   Lab Results  Component Value Date    CREATININE 0.88 09/09/2019   BUN 19 09/09/2019   NA 142 09/09/2019   K 4.5 09/09/2019   CL 101 09/09/2019   CO2 26 09/09/2019   Lab Results  Component Value Date   ALT 28 09/09/2019   AST 21 09/09/2019   ALKPHOS 93 09/09/2019   BILITOT 0.6 09/09/2019   Lab Results  Component Value Date   HGBA1C 5.8 (H) 09/09/2019   Lab Results  Component Value Date   INSULIN 11.8 09/09/2019   Lab Results  Component Value Date   TSH 1.700 09/09/2019   Lab Results  Component Value Date   CHOL 167 09/09/2019   HDL 43 09/09/2019   LDLCALC 107 (H) 09/09/2019   TRIG 90 09/09/2019   No results found for: WBC, HGB, HCT, MCV, PLT No results found for: IRON, TIBC, FERRITIN  Attestation Statements:   Reviewed by clinician on day of visit: allergies, medications, problem list, medical history, surgical history, family history, social history, and previous encounter notes.  Fernanda Drum, am acting as Energy manager for [employer], DO   I have reviewed the above documentation for accuracy and completeness, and I agree with the above. Corinna Capra, DO

## 2019-12-08 DIAGNOSIS — R1013 Epigastric pain: Secondary | ICD-10-CM | POA: Diagnosis not present

## 2019-12-08 DIAGNOSIS — R319 Hematuria, unspecified: Secondary | ICD-10-CM | POA: Diagnosis not present

## 2019-12-08 DIAGNOSIS — Z1159 Encounter for screening for other viral diseases: Secondary | ICD-10-CM | POA: Diagnosis not present

## 2019-12-08 DIAGNOSIS — R739 Hyperglycemia, unspecified: Secondary | ICD-10-CM | POA: Diagnosis not present

## 2019-12-08 DIAGNOSIS — R350 Frequency of micturition: Secondary | ICD-10-CM | POA: Diagnosis not present

## 2019-12-08 DIAGNOSIS — Z23 Encounter for immunization: Secondary | ICD-10-CM | POA: Diagnosis not present

## 2019-12-08 DIAGNOSIS — I10 Essential (primary) hypertension: Secondary | ICD-10-CM | POA: Diagnosis not present

## 2019-12-09 DIAGNOSIS — R1013 Epigastric pain: Secondary | ICD-10-CM | POA: Diagnosis not present

## 2019-12-13 DIAGNOSIS — M79671 Pain in right foot: Secondary | ICD-10-CM | POA: Diagnosis not present

## 2019-12-20 ENCOUNTER — Ambulatory Visit (INDEPENDENT_AMBULATORY_CARE_PROVIDER_SITE_OTHER): Payer: BC Managed Care – PPO | Admitting: Family Medicine

## 2019-12-20 DIAGNOSIS — Z1211 Encounter for screening for malignant neoplasm of colon: Secondary | ICD-10-CM | POA: Diagnosis not present

## 2020-01-03 ENCOUNTER — Ambulatory Visit
Admission: EM | Admit: 2020-01-03 | Discharge: 2020-01-03 | Disposition: A | Discharge status: Home / Self Care | Payer: BC Managed Care – PPO | Attending: Internal Medicine | Admitting: Internal Medicine

## 2020-01-03 ENCOUNTER — Ambulatory Visit (INDEPENDENT_AMBULATORY_CARE_PROVIDER_SITE_OTHER): Payer: BC Managed Care – PPO

## 2020-01-03 ENCOUNTER — Encounter: Payer: Self-pay | Admitting: Emergency Medicine

## 2020-01-03 ENCOUNTER — Other Ambulatory Visit: Payer: Self-pay

## 2020-01-03 DIAGNOSIS — R091 Pleurisy: Secondary | ICD-10-CM | POA: Insufficient documentation

## 2020-01-03 DIAGNOSIS — N3001 Acute cystitis with hematuria: Secondary | ICD-10-CM | POA: Diagnosis not present

## 2020-01-03 DIAGNOSIS — R0602 Shortness of breath: Secondary | ICD-10-CM | POA: Diagnosis not present

## 2020-01-03 LAB — URINALYSIS, COMPLETE (UACMP) WITH MICROSCOPIC
Bilirubin Urine: NEGATIVE
Glucose, UA: NEGATIVE mg/dL
Ketones, ur: NEGATIVE mg/dL
Nitrite: POSITIVE — AB
Protein, ur: NEGATIVE mg/dL
Specific Gravity, Urine: >1.03 — ABNORMAL HIGH (ref 1.005–1.030)
pH: 5 (ref 5.0–8.0)

## 2020-01-03 MED ORDER — IBUPROFEN 800 MG PO TABS
800.0000 mg | ORAL_TABLET | Freq: Three times a day (TID) | ORAL | 0 refills | Status: DC
Start: 2020-01-03 — End: 2020-10-18

## 2020-01-03 MED ORDER — NITROFURANTOIN MONOHYD MACRO 100 MG PO CAPS: 100.0000 mg | ORAL_CAPSULE | Freq: Two times a day (BID) | ORAL | 0 refills | Status: DC

## 2020-01-03 NOTE — ED Triage Notes (Signed)
Pt c/o shortness of breath. Started about 2 days ago. She states she has pain in her chest when she takes a deep breath. She took her inhaler and helped some. she states she has reactive airway disease. She also has dysuria and spotting when she urinates. She states she has had a UTI before and had the same symptoms.
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
## 2020-01-03 NOTE — Discharge Instructions (Addendum)
I will have you try Ibuprofen 800 mg three times a day for inflammation for the next 7 days  Exercise your lungs by taking deep breaths and holding it for 10 seconds and blowing out as much as you want.   Please follow up with your family Dr to check your lung symptoms, you may need to get a spirometer to help you exercise your lungs.

## 2020-01-04 DIAGNOSIS — R5383 Other fatigue: Secondary | ICD-10-CM | POA: Diagnosis not present

## 2020-01-04 DIAGNOSIS — N390 Urinary tract infection, site not specified: Secondary | ICD-10-CM | POA: Diagnosis not present

## 2020-01-04 DIAGNOSIS — R0781 Pleurodynia: Secondary | ICD-10-CM | POA: Diagnosis not present

## 2020-01-04 DIAGNOSIS — R42 Dizziness and giddiness: Secondary | ICD-10-CM | POA: Diagnosis not present

## 2020-01-05 LAB — URINE CULTURE
Culture: >=100000 — AB
Special Requests: NORMAL

## 2020-01-11 ENCOUNTER — Other Ambulatory Visit: Payer: Self-pay

## 2020-01-11 ENCOUNTER — Ambulatory Visit (INDEPENDENT_AMBULATORY_CARE_PROVIDER_SITE_OTHER): Payer: BC Managed Care – PPO | Admitting: Bariatrics

## 2020-01-11 ENCOUNTER — Encounter (INDEPENDENT_AMBULATORY_CARE_PROVIDER_SITE_OTHER): Payer: Self-pay | Admitting: Bariatrics

## 2020-01-11 VITALS — BP 123/75 | HR 70 | Temp 98.3°F | Ht 67.0 in | Wt 222.0 lb

## 2020-01-11 DIAGNOSIS — Z6834 Body mass index (BMI) 34.0-34.9, adult: Secondary | ICD-10-CM | POA: Diagnosis not present

## 2020-01-11 DIAGNOSIS — E669 Obesity, unspecified: Secondary | ICD-10-CM | POA: Diagnosis not present

## 2020-01-11 DIAGNOSIS — I10 Essential (primary) hypertension: Secondary | ICD-10-CM

## 2020-01-11 DIAGNOSIS — E8881 Metabolic syndrome: Secondary | ICD-10-CM | POA: Diagnosis not present

## 2020-01-12 ENCOUNTER — Encounter (INDEPENDENT_AMBULATORY_CARE_PROVIDER_SITE_OTHER): Payer: Self-pay | Admitting: Bariatrics

## 2020-01-12 ENCOUNTER — Other Ambulatory Visit: Payer: Self-pay

## 2020-01-12 ENCOUNTER — Ambulatory Visit
Admission: EM | Admit: 2020-01-12 | Discharge: 2020-01-12 | Disposition: A | Discharge status: Home / Self Care | Payer: BC Managed Care – PPO | Attending: Internal Medicine | Admitting: Internal Medicine

## 2020-01-12 ENCOUNTER — Encounter: Payer: Self-pay | Admitting: Emergency Medicine

## 2020-01-12 DIAGNOSIS — N3090 Cystitis, unspecified without hematuria: Secondary | ICD-10-CM | POA: Diagnosis not present

## 2020-01-12 LAB — URINALYSIS, COMPLETE (UACMP) WITH MICROSCOPIC
Bilirubin Urine: NEGATIVE
Glucose, UA: NEGATIVE mg/dL
Ketones, ur: NEGATIVE mg/dL
Nitrite: POSITIVE — AB
RBC / HPF: >50 RBC/hpf (ref 0–5)
Specific Gravity, Urine: 1.025 (ref 1.005–1.030)
pH: 5.5 (ref 5.0–8.0)

## 2020-01-12 MED ORDER — PHENAZOPYRIDINE HCL 200 MG PO TABS
200.0000 mg | ORAL_TABLET | Freq: Three times a day (TID) | ORAL | 0 refills | Status: DC
Start: 2020-01-12 — End: 2020-02-10

## 2020-01-12 MED ORDER — SULFAMETHOXAZOLE-TRIMETHOPRIM 800-160 MG PO TABS
1.0000 | ORAL_TABLET | Freq: Two times a day (BID) | ORAL | 0 refills | Status: AC
Start: 2020-01-12 — End: 2020-01-22

## 2020-01-12 NOTE — Progress Notes (Signed)
Chief Complaint:   OBESITY Tara Wood is here to discuss her progress with her obesity treatment plan along with follow-up of her obesity related diagnoses. Tara Wood is on the Category 3 Plan and the [place] +200 calories and states she is following her eating plan approximately 80-90% of the time. Tara Wood states she is exercising for 0 minutes 0 times per week.  Today's visit was #: 10 Starting weight: 241 lbs Starting date: 06/21/2019 Today's weight: 222 lbs Today's date: 01/11/2020 Total lbs lost to date: 19 lbs Total lbs lost since last in-office visit: 1 lb  Interim History: Tara Wood is down 1 pound today.    Subjective:   1. Essential hypertension Review: taking medications as instructed, no medication side effects noted, no chest pain on exertion, no dyspnea on exertion, no swelling of ankles.  She is taking Aldactone.  BP Readings from Last 3 Encounters:  01/11/20 123/75  01/03/20 122/75  11/30/19 125/81   2. Insulin resistance Tara Wood has a diagnosis of insulin resistance based on her elevated fasting insulin level >5. She continues to work on diet and exercise to decrease her risk of diabetes.  She is on no medications.  Lab Results  Component Value Date   INSULIN 11.8 09/09/2019   Lab Results  Component Value Date   HGBA1C 5.8 (H) 09/09/2019   Assessment/Plan:   1. Essential hypertension Tara Wood is working on healthy weight loss and exercise to improve blood pressure control. We will watch for signs of hypotension as she continues her lifestyle modifications.  Continue medication.  2. Insulin resistance Tara Wood will continue to work on weight loss, exercise, and decreasing simple carbohydrates to help decrease the risk of diabetes. Tara Wood agreed to follow-up with [name] as directed to closely monitor her progress.  Decrease carbohydrates and increase healthy fats and protein.  3. Class 1 obesity with serious comorbidity and body mass index (BMI) of 34.0 to 34.9  in adult, unspecified obesity type Tara Wood is currently in the action stage of change. As such, her goal is to continue with weight loss efforts. She has agreed to the Category 3 Plan and the [place] +200 calories.   Exercise goals: Use elliptical several days per week.  Behavioral modification strategies: increasing lean protein intake, decreasing simple carbohydrates, increasing vegetables, increasing water intake, decreasing eating out, no skipping meals, meal planning and cooking strategies, keeping healthy foods in the home and planning for success.  Tara Wood will work on meal planning, intentional eating, and increasing protein (protein equivalents).  Tara Wood has agreed to follow-up with our clinic in 2 weeks. She was informed of the importance of frequent follow-up visits to maximize her success with intensive lifestyle modifications for her multiple health conditions.   Objective:   Blood pressure 123/75, pulse 70, temperature 98.3 F (36.8 C), height 5\' 7"  (1.702 m), weight 222 lb (100.7 kg), SpO2 99 %. Body mass index is 34.77 kg/m.  General: Cooperative, alert, well developed, in no acute distress. HEENT: Conjunctivae and lids unremarkable. Cardiovascular: Regular rhythm.  Lungs: Normal work of breathing. Neurologic: No focal deficits.   Lab Results  Component Value Date   CREATININE 0.88 09/09/2019   BUN 19 09/09/2019   NA 142 09/09/2019   K 4.5 09/09/2019   CL 101 09/09/2019   CO2 26 09/09/2019   Lab Results  Component Value Date   ALT 28 09/09/2019   AST 21 09/09/2019   ALKPHOS 93 09/09/2019   BILITOT 0.6 09/09/2019   Lab Results  Component Value Date   HGBA1C 5.8 (H) 09/09/2019   Lab Results  Component Value Date   INSULIN 11.8 09/09/2019   Lab Results  Component Value Date   TSH 1.700 09/09/2019   Lab Results  Component Value Date   CHOL 167 09/09/2019   HDL 43 09/09/2019   LDLCALC 107 (H) 09/09/2019   TRIG 90 09/09/2019   Attestation  Statements:   Reviewed by clinician on day of visit: allergies, medications, problem list, medical history, surgical history, family history, social history, and previous encounter notes.  Time spent on visit including pre-visit chart review and post-visit care and charting was 20 minutes.   I, Insurance claims handler, CMA, am acting as Energy manager for [employer], DO  I have reviewed the above documentation for accuracy and completeness, and I agree with the above. Corinna Capra, DO

## 2020-01-12 NOTE — ED Provider Notes (Signed)
[facility]    CSN: [id number] Arrival date & time: 01/12/20  1727      History   Chief Complaint Chief Complaint  Patient presents with  . Dysuria  . Hematuria  . Urinary Frequency  . Urinary Urgency    HPI Tara Wood is a 54 y.o. female. presents with recurrent UTI this week and she finished macrobid  7 days ago. She felt better during the weekend and came back after having sex. She is post menopausal. Has hx of hematuria and was told it is due to something present on top of her bladder. She denies flank pain or fever.     Past Medical History:  Diagnosis Date  . B12 deficiency   . Borderline high blood pressure   . Chest pain   . Joint pain   . Obesity   . Palpitations   . PVC (premature ventricular contraction)   . Reactive airway disease   . SOB (shortness of breath)   . Swallowing difficulty   . Swelling of right foot   . Vitamin D deficiency     Patient Active Problem List   Diagnosis Date Noted  . Prediabetes 09/23/2019  . Class 2 severe obesity with serious comorbidity and body mass index (BMI) of 35.0 to 35.9 in adult ([facility]) 08/14/2019  . Polyarthralgia 01/11/2019  . Essential hypertension 02/05/2017    Past Surgical History:  Procedure Laterality Date  . TUBAL LIGATION      OB History   No obstetric history on file.      Home Medications    Prior to Admission medications   Medication Sig Start Date End Date Taking? Authorizing Provider  Cholecalciferol (VITAMIN D3) 125 MCG (5000 UT) TABS Take by mouth.   Yes [provider]  cyanocobalamin 1000 MCG tablet Take 1,000 mcg by mouth daily.   Yes [provider]  ibuprofen (ADVIL) 800 MG tablet Take 1 tablet (800 mg total) by mouth 3 (three) times daily. 01/03/20  Yes Rodriguez-Southworth, Sunday Spillers, PA-C  magnesium 30 MG tablet Take 100 mg by mouth 2 (two) times daily.   Yes [provider]  spironolactone (ALDACTONE) 25 MG tablet Take 1 tablet  (25 mg total) by mouth daily. 11/30/19  Yes Jearld Lesch A, DO  fluticasone (FLOVENT HFA) 110 MCG/ACT inhaler INHALE 1 PUFF INTO THE LUNGS TWICE DAILY 12/23/19   [provider]  nitrofurantoin, macrocrystal-monohydrate, (MACROBID) 100 MG capsule Take 1 capsule (100 mg total) by mouth 2 (two) times daily. 01/03/20   Rodriguez-Southworth, Sunday Spillers, PA-C  buPROPion (WELLBUTRIN SR) 150 MG 12 hr tablet Take 1 tablet (150 mg total) by mouth daily. 09/23/19 01/03/20  Jearld Lesch A, DO  fexofenadine (ALLEGRA) 180 MG tablet Take 180 mg by mouth daily.  07/02/19  [provider]    Family History Family History  Problem Relation Age of Onset  . Heart failure Mother   . Parkinson's disease Mother   . Heart disease Mother   . Stroke Father   . High blood pressure Father     Social History Social History   Tobacco Use  . Smoking status: Never Smoker  . Smokeless tobacco: Never Used  Vaping Use  . Vaping Use: Never used  Substance Use Topics  . Alcohol use: Never  . Drug use: Never     Allergies   Patient has no known allergies.   Review of Systems Review of Systems + for Dysuria, frequency, urgency and saw blood in the  tissue. Denies fever chills, sweats, flank pain, N/V  Physical Exam Triage Vital Signs ED Triage Vitals  Enc Vitals Group     BP 01/12/20 1743 113/76     Pulse Rate 01/12/20 1743 86     Resp 01/12/20 1743 18     Temp 01/12/20 1743 98.5 F (36.9 C)     Temp Source 01/12/20 1743 Oral     SpO2 01/12/20 1743 99 %     Weight 01/12/20 1742 222 lb (100.7 kg)     Height 01/12/20 1742 5\' 7"  (1.702 m)     Head Circumference --      Peak Flow --      Pain Score 01/12/20 1742 5     Pain Loc --      Pain Edu? --      Excl. in [place]? --    No data found.  Updated Vital Signs BP 113/76 (BP Location: Left Arm)   Pulse 86   Temp 98.5 F (36.9 C) (Oral)   Resp 18   Ht 5\' 7"  (1.702 m)   Wt 222 lb (100.7 kg)   LMP 10/12/2019 (Approximate)   SpO2 99%   BMI  34.77 kg/m   Visual Acuity Right Eye Distance:   Left Eye Distance:   Bilateral Distance:    Right Eye Near:   Left Eye Near:    Bilateral Near:     Physical Exam Constitutional:      General: She is not in acute distress.    Appearance: She is obese. She is not toxic-appearing.  HENT:     Head: Atraumatic.     Right Ear: External ear normal.     Left Ear: External ear normal.  Eyes:     General: No scleral icterus.    Extraocular Movements: Extraocular movements intact.     Conjunctiva/sclera: Conjunctivae normal.     Pupils: Pupils are equal, round, and reactive to light.  Abdominal:     General: Bowel sounds are normal.     Palpations: Abdomen is soft.     Tenderness: There is no abdominal tenderness. There is no right CVA tenderness or left CVA tenderness.  Musculoskeletal:        General: Normal range of motion.     Cervical back: Neck supple.  Skin:    General: Skin is warm and dry.     Findings: No rash.  Neurological:     Mental Status: She is alert and oriented to person, place, and time.     Gait: Gait normal.  Psychiatric:        Mood and Affect: Mood normal.        Behavior: Behavior normal.        Thought Content: Thought content normal.        Judgment: Judgment normal.      UC Treatments / Results  Labs (all labs ordered are listed, but only abnormal results are displayed) Labs Reviewed  URINALYSIS, COMPLETE (UACMP) WITH MICROSCOPIC - Abnormal; Notable for the following components:      Result Value   Hgb urine dipstick LARGE (*)    Protein, ur TRACE (*)    Nitrite POSITIVE (*)    Leukocytes,Ua MODERATE (*)    Bacteria, UA FEW (*)    All other components within normal limits    EKG   Radiology No results found.  Procedures Procedures (including critical care time)  Medications Ordered in UC Medications - No data to display  Initial Impression /  Assessment and Plan / UC Course  I have reviewed the triage vital signs and the  nursing notes.  Pertinent labs results that were available during my care of the patient were reviewed by me and considered in my medical decision making (see chart for details). Urine was sent out for a culture and she was placed on Bactrim DS bid x 10 days.  We will call her when the urine culture results are back if we need to change her medication.    Final Clinical Impressions(s) / UC Diagnoses   Final diagnoses:  None   Discharge Instructions   None    ED Prescriptions    None     PDMP not reviewed this encounter.   Garey Ham, [place] 01/13/20 [phone]

## 2020-01-12 NOTE — ED Triage Notes (Signed)
Patient in today c/o dysuria, hematuria, urinary frequency and urgency x 2-3 days. Patient just finished an antibiotic for a UTI on 01/08/20. Patient denies vaginal discharge.

## 2020-02-10 ENCOUNTER — Ambulatory Visit (INDEPENDENT_AMBULATORY_CARE_PROVIDER_SITE_OTHER): Payer: BC Managed Care – PPO | Admitting: Bariatrics

## 2020-02-10 ENCOUNTER — Encounter (INDEPENDENT_AMBULATORY_CARE_PROVIDER_SITE_OTHER): Payer: Self-pay | Admitting: Bariatrics

## 2020-02-10 ENCOUNTER — Other Ambulatory Visit: Payer: Self-pay

## 2020-02-10 VITALS — BP 109/72 | HR 71 | Temp 98.3°F | Ht 67.0 in | Wt 226.0 lb

## 2020-02-10 DIAGNOSIS — Z6835 Body mass index (BMI) 35.0-35.9, adult: Secondary | ICD-10-CM

## 2020-02-10 DIAGNOSIS — I10 Essential (primary) hypertension: Secondary | ICD-10-CM | POA: Diagnosis not present

## 2020-02-10 DIAGNOSIS — E8881 Metabolic syndrome: Secondary | ICD-10-CM | POA: Diagnosis not present

## 2020-02-10 DIAGNOSIS — M2550 Pain in unspecified joint: Secondary | ICD-10-CM | POA: Diagnosis not present

## 2020-02-10 DIAGNOSIS — Z9189 Other specified personal risk factors, not elsewhere classified: Secondary | ICD-10-CM

## 2020-02-10 NOTE — Progress Notes (Signed)
Chief Complaint:   OBESITY Tara Wood is here to discuss her progress with her obesity treatment plan along with follow-up of her obesity related diagnoses. Tara Wood is on the Category 3 Plan and the Pescatarian Plan and states she is following her eating plan approximately 80% of the time. Tara Wood states she is exercising 0 minutes 0 times per week.  Today's visit was #: 11 Starting weight: 241 lbs Starting date: 06/21/2019 Today's weight: 226 lbs Today's date: 02/10/2020 Total lbs lost to date: 15 Total lbs lost since last in-office visit: 0  Interim History: Tara Wood is up 4 lbs. She has been snacking more and going over her snack calories. She reports having foot pain.  Subjective:   Essential hypertension. Tara Wood is taking aldactone. Blood pressure is controlled.  BP Readings from Last 3 Encounters:  02/10/20 109/72  01/12/20 113/76  01/11/20 123/75   Lab Results  Component Value Date   CREATININE 0.88 09/09/2019   CREATININE 0.67 06/21/2019   Insulin resistance. Tara Wood has a diagnosis of insulin resistance based on her elevated fasting insulin level >5. She continues to work on diet and exercise to decrease her risk of diabetes. Tara Wood is on no medication. A1c was reported to be 5.3 on 12/08/2019, down from 5.8 on 09/09/2019.  Lab Results  Component Value Date   INSULIN 11.8 09/09/2019   Lab Results  Component Value Date   HGBA1C 5.8 (H) 09/09/2019   Polyarthralgia. [name] is taking Advil as needed.  At risk for activity intolerance. [name] is at risk of exercise intolerance due to foot pain.  Assessment/Plan:   Essential hypertension. Tara Wood is working on healthy weight loss and exercise to improve blood pressure control. We will watch for signs of hypotension as she continues her lifestyle modifications.  Tara Wood will continue her medication as directed.   Insulin resistance. Annalyssa will continue to work on weight loss, exercise, increasing  healthy fats and protein, and decreasing simple carbohydrates to help decrease the risk of diabetes. [name] agreed to follow-up with [name] as directed to closely monitor her progress. Comprehensive metabolic panel, Insulin, random, Vitamin B12 ordered today.  Polyarthralgia.  Tara Wood will gradually increase activities as tolerated.  At risk for activity intolerance. Tara Wood was given approximately 15 minutes of exercise intolerance counseling today. She is 54 y.o. female and has risk factors exercise intolerance including obesity. We discussed intensive lifestyle modifications today with an emphasis on specific weight loss instructions and strategies. Tara Wood will slowly increase activity as tolerated.  Repetitive spaced learning was employed today to elicit superior memory formation and behavioral change.  Class 2 severe obesity with serious comorbidity and body mass index (BMI) of 35.0 to 35.9 in adult, unspecified obesity type (HCC).  Staphanie is currently in the action stage of change. As such, her goal is to continue with weight loss efforts. She has agreed to the Category 3 Plan.   She will work on meal planning, intentional eating, and increasing her protein intake.   Exercise goals: All adults should avoid inactivity. Some physical activity is better than none, and adults who participate in any amount of physical activity gain some health benefits.  Behavioral modification strategies: increasing lean protein intake, decreasing simple carbohydrates, increasing vegetables, increasing water intake, decreasing eating out, no skipping meals, meal planning and cooking strategies, keeping healthy foods in the home and planning for success.  Tara Wood has agreed to follow-up with our clinic in 2-3 weeks. She was informed of the importance of frequent  follow-up visits to maximize her success with intensive lifestyle modifications for her multiple health conditions.   Tara Wood was informed we would discuss her  lab results at her next visit unless there is a critical issue that needs to be addressed sooner. Tara Wood agreed to keep her next visit at the agreed upon time to discuss these results.  Objective:   Blood pressure 109/72, pulse 71, temperature 98.3 F (36.8 C), height 5\' 7"  (1.702 m), weight 226 lb (102.5 kg), SpO2 98 %. Body mass index is 35.4 kg/m.  General: Cooperative, alert, well developed, in no acute distress. HEENT: Conjunctivae and lids unremarkable. Cardiovascular: Regular rhythm.  Lungs: Normal work of breathing. Neurologic: No focal deficits.   Lab Results  Component Value Date   CREATININE 0.88 09/09/2019   BUN 19 09/09/2019   NA 142 09/09/2019   K 4.5 09/09/2019   CL 101 09/09/2019   CO2 26 09/09/2019   Lab Results  Component Value Date   ALT 28 09/09/2019   AST 21 09/09/2019   ALKPHOS 93 09/09/2019   BILITOT 0.6 09/09/2019   Lab Results  Component Value Date   HGBA1C 5.8 (H) 09/09/2019   Lab Results  Component Value Date   INSULIN 11.8 09/09/2019   Lab Results  Component Value Date   TSH 1.700 09/09/2019   Lab Results  Component Value Date   CHOL 167 09/09/2019   HDL 43 09/09/2019   LDLCALC 107 (H) 09/09/2019   TRIG 90 09/09/2019   No results found for: WBC, HGB, HCT, MCV, PLT No results found for: IRON, TIBC, FERRITIN  Attestation Statements:   Reviewed by clinician on day of visit: allergies, medications, problem list, medical history, surgical history, family history, social history, and previous encounter notes.  11/07/2019, am acting as Fernanda Drum for Energy manager, DO   I have reviewed the above documentation for accuracy and completeness, and I agree with the above. [employer], DO

## 2020-02-11 LAB — COMPREHENSIVE METABOLIC PANEL
ALT: 30 IU/L (ref 0–32)
AST: 30 IU/L (ref 0–40)
Albumin/Globulin Ratio: 1.3 (ref 1.2–2.2)
Albumin: 4.5 g/dL (ref 3.8–4.9)
Alkaline Phosphatase: 91 IU/L (ref 48–121)
BUN/Creatinine Ratio: 19 (ref 9–23)
BUN: 16 mg/dL (ref 6–24)
Bilirubin Total: 0.6 mg/dL (ref 0.0–1.2)
CO2: 28 mmol/L (ref 20–29)
Calcium: 9.8 mg/dL (ref 8.7–10.2)
Chloride: 101 mmol/L (ref 96–106)
Creatinine, Ser: 0.83 mg/dL (ref 0.57–1.00)
GFR calc Af Amer: 93 mL/min/{1.73_m2} (ref >59)
GFR calc non Af Amer: 81 mL/min/{1.73_m2} (ref >59)
Globulin, Total: 3.5 g/dL (ref 1.5–4.5)
Glucose: 88 mg/dL (ref 65–99)
Potassium: 4.6 mmol/L (ref 3.5–5.2)
Sodium: 140 mmol/L (ref 134–144)
Total Protein: 8 g/dL (ref 6.0–8.5)

## 2020-02-11 LAB — VITAMIN B12: Vitamin B-12: 1015 pg/mL (ref 232–1245)

## 2020-02-11 LAB — INSULIN, RANDOM: INSULIN: 21.3 u[IU]/mL (ref 2.6–24.9)

## 2020-02-14 ENCOUNTER — Encounter (INDEPENDENT_AMBULATORY_CARE_PROVIDER_SITE_OTHER): Payer: Self-pay | Admitting: Bariatrics

## 2020-03-02 ENCOUNTER — Ambulatory Visit (INDEPENDENT_AMBULATORY_CARE_PROVIDER_SITE_OTHER): Payer: BC Managed Care – PPO | Admitting: Bariatrics

## 2020-04-05 DIAGNOSIS — R399 Unspecified symptoms and signs involving the genitourinary system: Secondary | ICD-10-CM | POA: Diagnosis not present

## 2020-04-05 DIAGNOSIS — N898 Other specified noninflammatory disorders of vagina: Secondary | ICD-10-CM | POA: Diagnosis not present

## 2020-04-05 DIAGNOSIS — Z23 Encounter for immunization: Secondary | ICD-10-CM | POA: Diagnosis not present

## 2020-04-10 DIAGNOSIS — M722 Plantar fascial fibromatosis: Secondary | ICD-10-CM | POA: Diagnosis not present

## 2020-04-10 DIAGNOSIS — M79671 Pain in right foot: Secondary | ICD-10-CM | POA: Diagnosis not present

## 2020-08-16 ENCOUNTER — Encounter: Payer: Self-pay | Admitting: Obstetrics and Gynecology

## 2020-08-16 NOTE — Progress Notes (Signed)
PCP: Hughie Closs, PA-C   Chief Complaint  Patient presents with  . Gynecologic Exam    HPI:      Ms. Tara Wood is a 55 y.o. No obstetric history on file. whose LMP was No LMP recorded (lmp unknown). Patient is premenopausal., presents today for her NP> 3 yrs annual examination.  Her menses are infrequent due to perimenopause. Menses Q1-6 months, lasting 7 days, mod flow. No BTB, mild dysmen. Hasn't had a period since 10/20 or 10/21, doesn't remember which.   Sex activity: single partner. She does not have vaginal dryness but has a tender spot LT vaginal opening with trace amt bleeding with wiping. Rarely uses lubricants. Has decreased sensation with sex and no pleasure. Just feels like pressure. Tried pelvic PT a few yrs ago without much improvement.  Has noticed odor after sex past few months. Hx of BV in past but not treated per pt. Denies sx otherwise.   Last Pap: not recent; no hx of abn paps.  Last mammogram: 08/19/18  Results were: normal--routine follow-up in 12 months There is a FH of breast cancer and ovarian cancer in her mat aunt. The patient does not do self-breast exams. 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 Increased risk of colon cancer but no medical mgmt options per NCCN 2022.  Colonoscopy: not recent  Tobacco use: The patient denies current or previous tobacco use. Alcohol use: none  No drug use Exercise: not active  She does not get adequate calcium but does get Vitamin D in her diet.  Hx of frequent UTIs. Has urinary urgency and frequency. Occas has caffeine. Hx of hematuria on UA, saw urology in distant past.  Labs with PCP.   Past Medical History:  Diagnosis Date  . B12 deficiency   . Borderline high blood pressure   . Chest pain   . Joint pain   . 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 Obesity   . Palpitations   . PVC (premature ventricular contraction)   .  Reactive airway disease   . SOB (shortness of breath)   . Swallowing difficulty   . Swelling of right foot   . Vitamin D deficiency     Past Surgical History:  Procedure Laterality Date  . TUBAL LIGATION      Family History  Problem Relation Age of Onset  . Heart failure Mother   . Parkinson's disease Mother   . Heart disease Mother   . Stroke Father   . High blood pressure Father   . Hypertension Father   . Breast cancer Maternal Aunt        lat eyears  . Ovarian cancer Maternal Aunt        not sure of age    Social History   Socioeconomic History  . Marital status: Married    Spouse name: Terrial Rhodes  . Number of children: Not on file  . Years of education: Not on file  . Highest education level: Not on file  Occupational History  . 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 Smoking status: Never Smoker  . Smokeless tobacco: Never Used  Vaping Use  . Vaping Use: Never used  Substance and Sexual Activity  . Alcohol use: Never  . Drug use: Never  . Sexual activity: Yes  Other Topics Concern  . Not on file  Social History Narrative  . Not on  file   Social Determinants of Health   Financial Resource Strain: Not on file  Food Insecurity: Not on file  Transportation Needs: Not on file  Physical Activity: Not on file  Stress: Not on file  Social Connections: Not on file  Intimate Partner Violence: Not on file     Current Outpatient Medications:  .  Cholecalciferol (VITAMIN D3) 125 MCG (5000 UT) TABS, Take by mouth., Disp: , Rfl:  .  cyanocobalamin 1000 MCG tablet, Take 1,000 mcg by mouth daily., Disp: , Rfl:  .  ibuprofen (ADVIL) 800 MG tablet, Take 1 tablet (800 mg total) by mouth 3 (three) times daily., Disp: 21 tablet, Rfl: 0 .  levalbuterol (XOPENEX HFA) 45 MCG/ACT inhaler, Inhale into the lungs., Disp: , Rfl:  .  spironolactone (ALDACTONE) 25 MG tablet, Take 1 tablet (25 mg total) by mouth daily., Disp: 30 tablet, Rfl: 0 .   metroNIDAZOLE (FLAGYL) 500 MG tablet, Take 1 tablet (500 mg total) by mouth 2 (two) times daily for 7 days., Disp: 14 tablet, Rfl: 0 .  pantoprazole (PROTONIX) 40 MG tablet, Take by mouth. (Patient not taking: Reported on 08/17/2020), Disp: , Rfl:      ROS:  Review of Systems  Constitutional: Negative for fatigue, fever and unexpected weight change.  Respiratory: Negative for cough, shortness of breath and wheezing.   Cardiovascular: Negative for chest pain, palpitations and leg swelling.  Gastrointestinal: Negative for blood in stool, constipation, diarrhea, nausea and vomiting.  Endocrine: Negative for cold intolerance, heat intolerance and polyuria.  Genitourinary: Positive for dyspareunia, frequency and urgency. Negative for dysuria, flank pain, genital sores, hematuria, menstrual problem, pelvic pain, vaginal bleeding, vaginal discharge and vaginal pain.  Musculoskeletal: Negative for back pain, joint swelling and myalgias.  Skin: Negative for rash.  Neurological: Negative for dizziness, syncope, light-headedness, numbness and headaches.  Hematological: Negative for adenopathy.  Psychiatric/Behavioral: Negative for agitation, confusion, sleep disturbance and suicidal ideas. The patient is not nervous/anxious.   BREAST: No symptoms    Objective: BP 130/84   Ht '5\' 7"'  (1.702 m)   Wt 237 lb (107.5 kg)   LMP  (LMP Unknown)   BMI 37.12 kg/m    Physical Exam Constitutional:      Appearance: She is well-developed.  Genitourinary:     Vulva normal.     Right Labia: No rash, tenderness or lesions.    Left Labia: No tenderness, lesions or rash.    No vaginal discharge, erythema or tenderness.      Right Adnexa: not tender and no mass present.    Left Adnexa: not tender and no mass present.    No cervical friability or polyp.     Uterus is not enlarged or tender.  Breasts:     Right: No mass, nipple discharge, skin change or tenderness.     Left: No mass, nipple discharge,  skin change or tenderness.    Neck:     Thyroid: No thyromegaly.  Cardiovascular:     Rate and Rhythm: Normal rate and regular rhythm.     Heart sounds: Normal heart sounds. No murmur heard.   Pulmonary:     Effort: Pulmonary effort is normal.     Breath sounds: Normal breath sounds.  Abdominal:     Palpations: Abdomen is soft.     Tenderness: There is no abdominal tenderness. There is no guarding or rebound.  Musculoskeletal:        General: Normal range of motion.     Cervical back:  Normal range of motion.  Lymphadenopathy:     Cervical: No cervical adenopathy.  Neurological:     General: No focal deficit present.     Mental Status: She is alert and oriented to person, place, and time.     Cranial Nerves: No cranial nerve deficit.  Skin:    General: Skin is warm and dry.  Psychiatric:        Mood and Affect: Mood normal.        Behavior: Behavior normal.        Thought Content: Thought content normal.        Judgment: Judgment normal.  Vitals reviewed.     Results: Results for orders placed or performed in visit on 08/17/20 (from the past 24 hour(s))  POCT Wet Prep with KOH     Status: Abnormal   Collection Time: 08/17/20 11:16 AM  Result Value Ref Range   Trichomonas, UA Negative    Clue Cells Wet Prep HPF POC few    Epithelial Wet Prep HPF POC     Yeast Wet Prep HPF POC neg    Bacteria Wet Prep HPF POC     RBC Wet Prep HPF POC     WBC Wet Prep HPF POC     KOH Prep POC Positive (A) Negative  POCT Urinalysis Dipstick     Status: Abnormal   Collection Time: 08/17/20 11:16 AM  Result Value Ref Range   Color, UA yellow    Clarity, UA clear    Glucose, UA Negative Negative   Bilirubin, UA neg    Ketones, UA neg    Spec Grav, UA 1.025 1.010 - 1.025   Blood, UA mod    pH, UA 5.0 5.0 - 8.0   Protein, UA Negative Negative   Urobilinogen, UA     Nitrite, UA neg    Leukocytes, UA Negative Negative   Appearance     Odor      Assessment/Plan:  Encounter for  annual routine gynecological examination  Cervical cancer screening - Plan: Cytology - PAP  Screening for HPV (human papillomavirus) - Plan: Cytology - PAP  Encounter for screening mammogram for malignant neoplasm of breast - Plan: MM 3D SCREEN BREAST BILATERAL; pt to sched mammo  Screening for colon cancer - Plan: Ambulatory referral to Gastroenterology; refer to GI  Monoallelic mutation of MUTYH gene - Plan: Ambulatory referral to Gastroenterology  Bacterial vaginosis - Plan: POCT Wet Prep with KOH, metroNIDAZOLE (FLAGYL) 500 MG tablet, DISCONTINUED: metroNIDAZOLE (FLAGYL) 500 MG tablet; pos sx after sex and pos wet prep. Rx flagyl, no EtOH. F/u prn.   Urinary urgency - Plan: POCT Urinalysis Dipstick, Urinalysis, Routine w reflex microscopic, Urine Culture; neg UA except hematuria. Check C&S and UA.   Asymptomatic microscopic hematuria - Plan: Urinalysis, Routine w reflex microscopic; check LC UA. If pos, will refer back to urology.   Meds ordered this encounter  Medications  . DISCONTD: metroNIDAZOLE (FLAGYL) 500 MG tablet    Sig: Take 1 tablet (500 mg total) by mouth 2 (two) times daily for 7 days.    Dispense:  14 tablet    Refill:  0    Order Specific Question:   Supervising Provider    Answer:   Gae Dry [id number]  . metroNIDAZOLE (FLAGYL) 500 MG tablet    Sig: Take 1 tablet (500 mg total) by mouth 2 (two) times daily for 7 days.    Dispense:  14 tablet    Refill:  0  Order Specific Question:   Supervising Provider    Answer:   Gae Dry [[id number]]            GYN counsel breast self exam, mammography screening, menopause, adequate intake of calcium and vitamin D, diet and exercise    F/U  Return in about 1 year (around 08/17/2021).  Fausto Sampedro B. Hadyn Azer, PA-C 08/17/2020 11:17 AM

## 2020-08-17 ENCOUNTER — Other Ambulatory Visit (HOSPITAL_COMMUNITY)
Admission: RE | Admit: 2020-08-17 | Discharge: 2020-08-17 | Disposition: A | Discharge status: Home / Self Care | Payer: Self-pay | Source: Ambulatory Visit | Attending: Obstetrics and Gynecology | Admitting: Obstetrics and Gynecology

## 2020-08-17 ENCOUNTER — Encounter: Payer: Self-pay | Admitting: Obstetrics and Gynecology

## 2020-08-17 ENCOUNTER — Other Ambulatory Visit: Payer: Self-pay

## 2020-08-17 ENCOUNTER — Ambulatory Visit (INDEPENDENT_AMBULATORY_CARE_PROVIDER_SITE_OTHER): Payer: PRIVATE HEALTH INSURANCE | Admitting: Obstetrics and Gynecology

## 2020-08-17 VITALS — BP 130/84 | Ht 67.0 in | Wt 237.0 lb

## 2020-08-17 DIAGNOSIS — Z1211 Encounter for screening for malignant neoplasm of colon: Secondary | ICD-10-CM

## 2020-08-17 DIAGNOSIS — Z124 Encounter for screening for malignant neoplasm of cervix: Secondary | ICD-10-CM | POA: Insufficient documentation

## 2020-08-17 DIAGNOSIS — Z01419 Encounter for gynecological examination (general) (routine) without abnormal findings: Secondary | ICD-10-CM

## 2020-08-17 DIAGNOSIS — R3121 Asymptomatic microscopic hematuria: Secondary | ICD-10-CM

## 2020-08-17 DIAGNOSIS — B9689 Other specified bacterial agents as the cause of diseases classified elsewhere: Secondary | ICD-10-CM

## 2020-08-17 DIAGNOSIS — Z1589 Genetic susceptibility to other disease: Secondary | ICD-10-CM

## 2020-08-17 DIAGNOSIS — N760 Acute vaginitis: Secondary | ICD-10-CM | POA: Diagnosis not present

## 2020-08-17 DIAGNOSIS — R3915 Urgency of urination: Secondary | ICD-10-CM | POA: Diagnosis not present

## 2020-08-17 DIAGNOSIS — Z1231 Encounter for screening mammogram for malignant neoplasm of breast: Secondary | ICD-10-CM | POA: Diagnosis not present

## 2020-08-17 DIAGNOSIS — Z1151 Encounter for screening for human papillomavirus (HPV): Secondary | ICD-10-CM | POA: Insufficient documentation

## 2020-08-17 LAB — POCT WET PREP WITH KOH
KOH Prep POC: POSITIVE — AB
Trichomonas, UA: NEGATIVE
Yeast Wet Prep HPF POC: NEGATIVE

## 2020-08-17 LAB — POCT URINALYSIS DIPSTICK
Bilirubin, UA: NEGATIVE
Glucose, UA: NEGATIVE
Ketones, UA: NEGATIVE
Leukocytes, UA: NEGATIVE
Nitrite, UA: NEGATIVE
Protein, UA: NEGATIVE
Spec Grav, UA: 1.025 (ref 1.010–1.025)
pH, UA: 5 (ref 5.0–8.0)

## 2020-08-17 MED ORDER — METRONIDAZOLE 500 MG PO TABS: 500.0000 mg | ORAL_TABLET | Freq: Two times a day (BID) | ORAL | 0 refills | Status: DC

## 2020-08-17 NOTE — Patient Instructions (Addendum)
I value your feedback and you entrusting us with your care. If you get a [facility] patient survey, I would appreciate you taking the time to let us know about your experience today. Thank you!  [facility]: [phone]      

## 2020-08-19 LAB — URINALYSIS, ROUTINE W REFLEX MICROSCOPIC
Bilirubin, UA: NEGATIVE
Glucose, UA: NEGATIVE
Ketones, UA: NEGATIVE
Leukocytes,UA: NEGATIVE
Nitrite, UA: NEGATIVE
Protein,UA: NEGATIVE
Specific Gravity, UA: 1.023 (ref 1.005–1.030)
Urobilinogen, Ur: 0.2 mg/dL (ref 0.2–1.0)
pH, UA: 5.5 (ref 5.0–7.5)

## 2020-08-19 LAB — MICROSCOPIC EXAMINATION
Bacteria, UA: NONE SEEN
Casts: NONE SEEN /lpf
WBC, UA: NONE SEEN /hpf (ref 0–5)

## 2020-08-19 LAB — URINE CULTURE: Organism ID, Bacteria: NO GROWTH

## 2020-08-21 LAB — CYTOLOGY - PAP
Comment: NEGATIVE
Diagnosis: NEGATIVE
High risk HPV: NEGATIVE

## 2020-08-21 NOTE — Addendum Note (Signed)
Addended by: [name] Grimmer B on: 08/21/2020 09:05 AM   Modules accepted: Orders

## 2020-08-23 ENCOUNTER — Other Ambulatory Visit: Payer: Self-pay

## 2020-08-23 ENCOUNTER — Telehealth (INDEPENDENT_AMBULATORY_CARE_PROVIDER_SITE_OTHER): Payer: Self-pay | Admitting: Gastroenterology

## 2020-08-23 DIAGNOSIS — Z1211 Encounter for screening for malignant neoplasm of colon: Secondary | ICD-10-CM

## 2020-08-23 NOTE — Progress Notes (Signed)
Gastroenterology Pre-Procedure Review  Request Date: Friday 10/20/20 Requesting Physician: Dr. Maximino Greenland  PATIENT REVIEW QUESTIONS: The patient responded to the following health history questions as indicated:    1. Are you having any GI issues? no 2. Do you have a personal history of Polyps? no 3. Do you have a family history of Colon Cancer or Polyps? no 4. Diabetes Mellitus? no 5. Joint replacements in the past 12 months?no 6. Major health problems in the past 3 months?no 7. Any artificial heart valves, MVP, or defibrillator?no    MEDICATIONS & ALLERGIES:    Patient reports the following regarding taking any anticoagulation/antiplatelet therapy:   Plavix, Coumadin, Eliquis, Xarelto, Lovenox, Pradaxa, Brilinta, or Effient? no Aspirin? no  Patient confirms/reports the following medications:  Current Outpatient Medications  Medication Sig Dispense Refill  . Cholecalciferol (VITAMIN D3) 125 MCG (5000 UT) TABS Take by mouth.    . cyanocobalamin 1000 MCG tablet Take 1,000 mcg by mouth daily.    Marland Kitchen ibuprofen (ADVIL) 800 MG tablet Take 1 tablet (800 mg total) by mouth 3 (three) times daily. (Patient taking differently: Take 800 mg by mouth 3 (three) times daily. PRN) 21 tablet 0  . levalbuterol (XOPENEX HFA) 45 MCG/ACT inhaler Inhale into the lungs.    . metroNIDAZOLE (FLAGYL) 500 MG tablet Take 500 mg by mouth 2 (two) times daily. 7 days    . spironolactone (ALDACTONE) 25 MG tablet Take 1 tablet (25 mg total) by mouth daily. 30 tablet 0  . pantoprazole (PROTONIX) 40 MG tablet Take by mouth. (Patient not taking: No sig reported)     No current facility-administered medications for this visit.    Patient confirms/reports the following allergies:  No Known Allergies  No orders of the defined types were placed in this encounter.   AUTHORIZATION INFORMATION Primary Insurance: 1D#: Group #:  Secondary Insurance: 1D#: Group #:  SCHEDULE INFORMATION: Date:  10/20/20 Time: Location:[facility]

## 2020-09-03 ENCOUNTER — Encounter: Payer: Self-pay | Admitting: Emergency Medicine

## 2020-09-03 ENCOUNTER — Ambulatory Visit
Admission: EM | Admit: 2020-09-03 | Discharge: 2020-09-03 | Disposition: A | Discharge status: Home / Self Care | Payer: PRIVATE HEALTH INSURANCE | Attending: Sports Medicine | Admitting: Sports Medicine

## 2020-09-03 ENCOUNTER — Other Ambulatory Visit: Payer: Self-pay

## 2020-09-03 ENCOUNTER — Ambulatory Visit (INDEPENDENT_AMBULATORY_CARE_PROVIDER_SITE_OTHER): Payer: PRIVATE HEALTH INSURANCE

## 2020-09-03 DIAGNOSIS — M25512 Pain in left shoulder: Secondary | ICD-10-CM

## 2020-09-03 DIAGNOSIS — M542 Cervicalgia: Secondary | ICD-10-CM

## 2020-09-03 DIAGNOSIS — M898X1 Other specified disorders of bone, shoulder: Secondary | ICD-10-CM

## 2020-09-03 DIAGNOSIS — W19XXXA Unspecified fall, initial encounter: Secondary | ICD-10-CM

## 2020-09-03 DIAGNOSIS — M25511 Pain in right shoulder: Secondary | ICD-10-CM

## 2020-09-03 NOTE — Discharge Instructions (Signed)
X-rays are negative for fracture  SPRAIN: Stressed avoiding painful activities . Reviewed RICE guidelines. Use medications as directed, including NSAIDs. If no NSAIDs have been prescribed for you today, you may take Aleve or Motrin over the counter. May use Tylenol in between doses of NSAIDs.  If no improvement in the next 1-2 weeks, f/u with PCP or return to our office for reexamination, and please feel free to call or return at any time for any questions or concerns you may have and we will be happy to help you!

## 2020-09-03 NOTE — ED Provider Notes (Signed)
[facility]    CSN: [id number] Arrival date & time: 09/03/20  1241      History   Chief Complaint Chief Complaint  Patient presents with  . Fall    DOI 08/31/20  . Shoulder Pain    Bilateral L>R  . Clavicle Injury    DOI 08/31/20    HPI Tara Wood is a 55 y.o. female presenting for left and right shoulder pain as well as left clavicular pain x 3 days. She says she slipped and fell and landed on her left side. She says she feels like her left shoulder "flew up."  She is having the most pain of the left proximal clavicle.  She initially had some pain on breathing but that has gotten better.  Does not any pain or breathing out.  She says she still feels pain along the clavicle.  She has full range of motion of her shoulders.  When raising her left shoulder all the way up she does have increased pain in the clavicle.  She also admits to some pain of the left side of the neck.  Denies any posterior neck pain.  Denies any pain down the extremities.  Full range of motion of the right shoulder as well.  No numbness, healing or weakness.  No previous fracture of any part of her shoulder or clavicles.  Denies any chest pain.  Has taken naproxen with some improvement in symptoms.  She was advised to be seen by her family members.  She has no other concerns.  HPI  Past Medical History:  Diagnosis Date  . B12 deficiency   . Borderline high blood pressure   . Chest pain   . Joint pain   . 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 Obesity   . Palpitations   . PVC (premature ventricular contraction)   . Reactive airway disease   . SOB (shortness of breath)   . Swallowing difficulty   . Swelling of right foot   . Vitamin D deficiency     Patient Active Problem List   Diagnosis Date Noted  . Prediabetes 09/23/2019  . Class 2 severe obesity with serious comorbidity and body mass index (BMI) of 35.0 to 35.9 in adult ([place]) 08/14/2019  . Diffuse  myofascial pain syndrome 04/13/2019  . Paresthesia and pain of right extremity 04/13/2019  . Polyarthralgia 01/11/2019  . Positive ANA (antinuclear antibody) 01/30/2018  . Right foot pain 12/11/2017  . Essential hypertension 02/05/2017  . RAD (reactive airway disease), mild intermittent, uncomplicated 02/72/5366  . Microscopic hematuria 02/01/2014  . 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 TUBAL LIGATION      OB History    Gravida  9   Para  7   Term  7   Preterm      AB  2   Living  7     SAB  1   IAB  1   Ectopic      Multiple      Live Births  7            Home Medications    Prior to Admission medications   Medication Sig Start Date End Date Taking? Authorizing Provider  Cholecalciferol (VITAMIN D3) 125 MCG (5000 UT) TABS Take by mouth.   Yes [provider]  cyanocobalamin 1000 MCG tablet Take 1,000 mcg by mouth daily.  Yes [provider]  ibuprofen (ADVIL) 800 MG tablet Take 1 tablet (800 mg total) by mouth 3 (three) times daily. Patient taking differently: Take 800 mg by mouth 3 (three) times daily. PRN 01/03/20  Yes Rodriguez-Southworth, Sunday Spillers, PA-C  levalbuterol [facility] HFA) 45 MCG/ACT inhaler Inhale into the lungs. 12/23/19 12/22/20 Yes [provider]  spironolactone (ALDACTONE) 25 MG tablet Take 1 tablet (25 mg total) by mouth daily. 11/30/19  Yes Jearld Lesch A, DO  metroNIDAZOLE (FLAGYL) 500 MG tablet Take 500 mg by mouth 2 (two) times daily. 7 days    [provider]  pantoprazole (PROTONIX) 40 MG tablet Take by mouth. Patient not taking: No sig reported 12/08/19 12/07/20  [provider]  buPROPion (WELLBUTRIN SR) 150 MG 12 hr tablet Take 1 tablet (150 mg total) by mouth daily. 09/23/19 01/03/20  Jearld Lesch A, DO  fexofenadine (ALLEGRA) 180 MG tablet Take 180 mg by mouth daily.  07/02/19  [provider]    Family History Family  History  Problem Relation Age of Onset  . Heart failure Mother   . Parkinson's disease Mother   . Heart disease Mother   . Stroke Father   . High blood pressure Father   . Hypertension Father   . Breast cancer Maternal Aunt        lat eyears  . Ovarian cancer Maternal Aunt        not sure of age    Social History Social History   Tobacco Use  . Smoking status: Never Smoker  . Smokeless tobacco: Never Used  Vaping Use  . Vaping Use: Never used  Substance Use Topics  . Alcohol use: Never  . Drug use: Never     Allergies   Patient has no known allergies.   Review of Systems Review of Systems  Respiratory: Negative for shortness of breath.   Cardiovascular: Negative for chest pain.  Musculoskeletal: Positive for arthralgias. Negative for back pain, joint swelling, neck pain and neck stiffness.  Skin: Negative for color change, rash and wound.  Neurological: Negative for dizziness, syncope, weakness, numbness and headaches.     Physical Exam Triage Vital Signs ED Triage Vitals  Enc Vitals Group     BP      Pulse      Resp      Temp      Temp src      SpO2      Weight      Height      Head Circumference      Peak Flow      Pain Score      Pain Loc      Pain Edu?      Excl. in [place]?    No data found.  Updated Vital Signs BP 115/80 (BP Location: Right Arm)   Pulse 84   Temp 98.4 F (36.9 C) (Oral)   Resp 18   Ht '5\' 7"'  (1.702 m)   Wt 237 lb (107.5 kg)   LMP  (LMP Unknown)   SpO2 100%   BMI 37.12 kg/m    Physical Exam Vitals and nursing note reviewed.  Constitutional:      General: She is not in acute distress.    Appearance: Normal appearance. She is not ill-appearing or toxic-appearing.  HENT:     Head: Normocephalic and atraumatic.  Eyes:     General: No scleral icterus.       Right eye: No discharge.  Left eye: No discharge.     Conjunctiva/sclera: Conjunctivae normal.  Cardiovascular:     Rate and Rhythm: Normal rate and regular  rhythm.     Heart sounds: Normal heart sounds.  Pulmonary:     Effort: Pulmonary effort is normal. No respiratory distress.     Breath sounds: Normal breath sounds.  Musculoskeletal:     Right shoulder: Normal. No tenderness. Normal range of motion.     Left shoulder: Swelling (mild swelling proximal left clavicle) and tenderness (proximal left clavicle) present. Normal range of motion.     Right upper arm: Normal.     Left upper arm: Normal.     Right elbow: Normal.     Left elbow: Normal.     Right forearm: Normal.     Left forearm: Normal.     Cervical back: Neck supple. Tenderness (mild TTP diffusely left neck muscles) present. Pain with movement present.  Skin:    General: Skin is dry.  Neurological:     General: No focal deficit present.     Mental Status: She is alert. Mental status is at baseline.     Motor: No weakness.     Gait: Gait normal.  Psychiatric:        Mood and Affect: Mood normal.        Behavior: Behavior normal.        Thought Content: Thought content normal.      UC Treatments / Results  Labs (all labs ordered are listed, but only abnormal results are displayed) Labs Reviewed - No data to display  EKG   Radiology DG Clavicle Left  Result Date: 09/03/2020 CLINICAL DATA:  Left clavicular pain after fall 3 days ago. EXAM: LEFT CLAVICLE - 2+ VIEWS COMPARISON:  None. FINDINGS: There is no evidence of fracture or other focal bone lesions. Soft tissues are unremarkable. IMPRESSION: Negative. Electronically Signed   By: Marijo Conception M.D.   On: 09/03/2020 13:33   DG Shoulder Right  Result Date: 09/03/2020 CLINICAL DATA:  Bilateral shoulder pain after fall 3 days ago. EXAM: RIGHT SHOULDER - 2+ VIEW COMPARISON:  None. FINDINGS: There is no evidence of fracture or dislocation. There is no evidence of arthropathy or other focal bone abnormality. Soft tissues are unremarkable. IMPRESSION: Negative. Electronically Signed   By: Marijo Conception M.D.   On:  09/03/2020 13:27   DG Shoulder Left  Result Date: 09/03/2020 CLINICAL DATA:  Bilateral shoulder pain after fall 3 days ago. EXAM: LEFT SHOULDER - 2+ VIEW COMPARISON:  None. FINDINGS: There is no evidence of fracture or dislocation. There is no evidence of arthropathy or other focal bone abnormality. Soft tissues are unremarkable. IMPRESSION: Negative. Electronically Signed   By: Marijo Conception M.D.   On: 09/03/2020 13:32    Procedures Procedures (including critical care time)  Medications Ordered in UC Medications - No data to display  Initial Impression / Assessment and Plan / UC Course  I have reviewed the triage vital signs and the nursing notes.  Pertinent labs & imaging results that were available during my care of the patient were reviewed by me and considered in my medical decision making (see chart for details).   X-rays of bilateral shoulders and left clavicle are negative for any fractures.  Independently reviewed imaging myself.  Advised patient that her symptoms are consistent with pulled muscles and probably a strain or sprain of her neck.  Advised continuing ibuprofen and Tylenol.  Advised to ice her  clavicle and other parts of shoulder as needed.  Advised some heat on her neck.  Advised to follow-up with our clinic or Ortho for any worsening symptoms.  ED precautions for neck pain reviewed.   Final Clinical Impressions(s) / UC Diagnoses   Final diagnoses:  Acute pain of left shoulder  Acute pain of right shoulder  Neck pain  Pain of left clavicle     Discharge Instructions     X-rays are negative for fracture  SPRAIN: Stressed avoiding painful activities . Reviewed RICE guidelines. Use medications as directed, including NSAIDs. If no NSAIDs have been prescribed for you today, you may take Aleve or Motrin over the counter. May use Tylenol in between doses of NSAIDs.  If no improvement in the next 1-2 weeks, f/u with PCP or return to our office for reexamination, and  please feel free to call or return at any time for any questions or concerns you may have and we will be happy to help you!        ED Prescriptions    None     PDMP not reviewed this encounter.   Danton Clap, PA-C 09/03/20 1434

## 2020-09-03 NOTE — ED Triage Notes (Signed)
Patient in today c/o bilateral shoulder pain L>R and clavicle pain after falling on 08/31/20. Patient has taken OTC Ibuprofen and has applied heat to the area.

## 2020-09-08 ENCOUNTER — Ambulatory Visit: Payer: Self-pay | Admitting: Urology

## 2020-09-11 ENCOUNTER — Encounter: Payer: Self-pay | Admitting: *Deleted

## 2020-10-18 ENCOUNTER — Ambulatory Visit (INDEPENDENT_AMBULATORY_CARE_PROVIDER_SITE_OTHER): Payer: PRIVATE HEALTH INSURANCE | Admitting: Obstetrics and Gynecology

## 2020-10-18 ENCOUNTER — Encounter: Payer: Self-pay | Admitting: Obstetrics and Gynecology

## 2020-10-18 ENCOUNTER — Other Ambulatory Visit: Admission: RE | Admit: 2020-10-18 | Payer: PRIVATE HEALTH INSURANCE | Source: Ambulatory Visit

## 2020-10-18 ENCOUNTER — Other Ambulatory Visit: Payer: Self-pay

## 2020-10-18 VITALS — BP 140/90 | Ht 67.0 in | Wt 244.0 lb

## 2020-10-18 DIAGNOSIS — N898 Other specified noninflammatory disorders of vagina: Secondary | ICD-10-CM | POA: Diagnosis not present

## 2020-10-18 DIAGNOSIS — R399 Unspecified symptoms and signs involving the genitourinary system: Secondary | ICD-10-CM

## 2020-10-18 NOTE — Patient Instructions (Signed)
I value your feedback and you entrusting us with your care. If you get a [facility] patient survey, I would appreciate you taking the time to let us know about your experience today. Thank you! ? ? ?

## 2020-10-18 NOTE — Progress Notes (Signed)
Tara Wood, [place]   Chief Complaint  Patient presents with  . Urinary Tract Infection    Frequency and burning urinating, nausea, cramping, lower back pain x 2 weeks  . Vaginal Odor    Dryness, no discharge    HPI:      Ms. Tara Wood is a 55 y.o. [id number] whose LMP was No LMP recorded. Patient is premenopausal., presents today for urinary frequency with dysuria for the past 2 wks. Was having pelvic cramping and LBP but that's improved. No hematuria, fevers, pelvic pain. Neg C&S 1/22 with hematuria on POC UA and LC UA. Hx of hematuria and recurrent UTIs. Referred to urology 1/22 but pt canceled appt; plans to reschedule. Saw [facility] in past and was supposed to have further testing but never did. Had E. Coli on C&S 6/21  Pt also with vaginal odor and some dryness (pt thinks from wiping too excessively), no increased d/c or irritation. Uses Dove sens skin soap, dryer sheets and unscented soaps. Had change in smell after covid. Husband hasn't noticed an odor, so pt unsure if she really has one. Treated for BV with flagyl 1/22 without sx relief per pt. Was having odor after sex and had pos wet prep.    Past Medical History:  Diagnosis Date  . B12 deficiency   . Borderline high blood pressure   . Chest pain   . Joint pain   . 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 Obesity   . Palpitations   . PVC (premature ventricular contraction)   . Reactive airway disease   . SOB (shortness of breath)   . Swallowing difficulty   . Swelling of right foot   . Vitamin D deficiency     Past Surgical History:  Procedure Laterality Date  . TUBAL LIGATION      Family History  Problem Relation Age of Onset  . Heart failure Mother   . Parkinson's disease Mother   . Heart disease Mother   . Stroke Father   . High blood pressure Father   . Hypertension Father   . Breast cancer Maternal Aunt        lat eyears  . Ovarian cancer Maternal  Aunt        not sure of age    Social History   Socioeconomic History  . Marital status: Married    Spouse name: Terrial Rhodes  . Number of children: Not on file  . Years of education: Not on file  . Highest education level: Not on file  Occupational History  . 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 Smoking status: Never Smoker  . Smokeless tobacco: Never Used  Vaping Use  . Vaping Use: Never used  Substance and Sexual Activity  . Alcohol use: Never  . Drug use: Never  . Sexual activity: Yes  Other Topics Concern  . Not on file  Social History Narrative  . Not on file   Social Determinants of Health   Financial Resource Strain: Not on file  Food Insecurity: Not on file  Transportation Needs: Not on file  Physical Activity: Not on file  Stress: Not on file  Social Connections: Not on file  Intimate Partner Violence: Not on file    Outpatient Medications Prior to Visit  Medication Sig Dispense Refill  . Cholecalciferol (VITAMIN D3) 125 MCG (5000 UT) TABS Take by mouth.    . cyanocobalamin 1000  MCG tablet Take 1,000 mcg by mouth daily.    Marland Kitchen levalbuterol (XOPENEX HFA) 45 MCG/ACT inhaler Inhale into the lungs.    . pantoprazole (PROTONIX) 40 MG tablet Take by mouth.    . spironolactone (ALDACTONE) 25 MG tablet Take 1 tablet (25 mg total) by mouth daily. 30 tablet 0  . ibuprofen (ADVIL) 800 MG tablet Take 1 tablet (800 mg total) by mouth 3 (three) times daily. (Patient taking differently: Take 800 mg by mouth 3 (three) times daily. PRN) 21 tablet 0  . metroNIDAZOLE (FLAGYL) 500 MG tablet Take 500 mg by mouth 2 (two) times daily. 7 days     No facility-administered medications prior to visit.      ROS:  Review of Systems  Constitutional: Negative for fever.  Gastrointestinal: Negative for blood in stool, constipation, diarrhea, nausea and vomiting.  Genitourinary: Positive for dysuria and frequency. Negative for dyspareunia, flank pain,  hematuria, urgency, vaginal bleeding, vaginal discharge and vaginal pain.  Musculoskeletal: Negative for back pain.  Skin: Negative for rash.    OBJECTIVE:   Vitals:  BP 140/90   Ht '5\' 7"'  (1.702 m)   Wt 244 lb (110.7 kg)   BMI 38.22 kg/m   Physical Exam Vitals reviewed.  Constitutional:      Appearance: She is well-developed.  Pulmonary:     Effort: Pulmonary effort is normal.  Genitourinary:    General: Normal vulva.     Pubic Area: No rash.      Labia:        Right: No rash, tenderness or lesion.        Left: No rash, tenderness or lesion.      Vagina: Normal. No vaginal discharge, erythema or tenderness.     Cervix: Normal.     Uterus: Normal. Not enlarged and not tender.      Adnexa: Right adnexa normal and left adnexa normal.       Right: No mass or tenderness.         Left: No mass or tenderness.    Musculoskeletal:        General: Normal range of motion.     Cervical back: Normal range of motion.  Skin:    General: Skin is warm and dry.  Neurological:     General: No focal deficit present.     Mental Status: She is alert and oriented to person, place, and time.  Psychiatric:        Mood and Affect: Mood normal.        Behavior: Behavior normal.        Thought Content: Thought content normal.        Judgment: Judgment normal.     Results: Results for orders placed or performed in visit on 10/18/20 (from the past 24 hour(s))  POCT Urinalysis Dipstick     Status: Abnormal   Collection Time: 10/19/20  9:38 AM  Result Value Ref Range   Color, UA yellow    Clarity, UA clear    Glucose, UA Negative Negative   Bilirubin, UA neg    Ketones, UA neg    Spec Grav, UA 1.015 1.010 - 1.025   Blood, UA trace    pH, UA 7.5 5.0 - 8.0   Protein, UA     Urobilinogen, UA     Nitrite, UA neg    Leukocytes, UA Negative Negative   Appearance     Odor    POCT Wet Prep with KOH  Status: Normal   Collection Time: 10/19/20  9:39 AM  Result Value Ref Range    Trichomonas, UA Negative    Clue Cells Wet Prep HPF POC neg    Epithelial Wet Prep HPF POC     Yeast Wet Prep HPF POC neg    Bacteria Wet Prep HPF POC     RBC Wet Prep HPF POC     WBC Wet Prep HPF POC     KOH Prep POC Negative Negative   TRACE HEMATURIA ON UA; NORMAL FOR PT  Assessment/Plan: UTI symptoms - Plan: POCT Urinalysis Dipstick, Urine Culture; neg UA except trace blood which is normal for pt. Check C&S. Will f/u if pos. Pt encouraged to reschedule with urology for recurrent sx and hematuria.   Vaginal odor - Plan: NuSwab Vaginitis (VG), POCT Wet Prep with KOH; pos sx, neg exam/wet prep. Check culture. Will f/u with results. If neg, question sense of smell altered due to covid. D/C dryer sheets, cont unscented products.      Return if symptoms worsen or fail to improve.  Obie Kallenbach B. Early Steel, PA-C 10/19/2020 9:41 AM

## 2020-10-19 ENCOUNTER — Encounter: Payer: Self-pay | Admitting: Obstetrics and Gynecology

## 2020-10-19 LAB — POCT URINALYSIS DIPSTICK
Bilirubin, UA: NEGATIVE
Glucose, UA: NEGATIVE
Ketones, UA: NEGATIVE
Leukocytes, UA: NEGATIVE
Nitrite, UA: NEGATIVE
Spec Grav, UA: 1.015 (ref 1.010–1.025)
pH, UA: 7.5 (ref 5.0–8.0)

## 2020-10-19 LAB — POCT WET PREP WITH KOH
Clue Cells Wet Prep HPF POC: NEGATIVE
KOH Prep POC: NEGATIVE
Trichomonas, UA: NEGATIVE
Yeast Wet Prep HPF POC: NEGATIVE

## 2020-10-20 ENCOUNTER — Ambulatory Visit
Admission: RE | Admit: 2020-10-20 | Payer: PRIVATE HEALTH INSURANCE | Source: Home / Self Care | Admitting: Gastroenterology

## 2020-10-20 ENCOUNTER — Encounter: Admission: RE | Payer: Self-pay | Source: Home / Self Care

## 2020-10-20 SURGERY — COLONOSCOPY WITH PROPOFOL
Anesthesia: General

## 2020-10-21 LAB — NUSWAB VAGINITIS (VG)
Candida albicans, NAA: NEGATIVE
Candida glabrata, NAA: NEGATIVE
Trich vag by NAA: NEGATIVE

## 2020-10-21 LAB — URINE CULTURE

## 2020-11-16 DIAGNOSIS — R0602 Shortness of breath: Secondary | ICD-10-CM | POA: Insufficient documentation

## 2020-11-22 ENCOUNTER — Ambulatory Visit: Payer: No Typology Code available for payment source | Admitting: Gastroenterology

## 2021-01-12 ENCOUNTER — Other Ambulatory Visit: Payer: Self-pay

## 2021-01-12 DIAGNOSIS — J4520 Mild intermittent asthma, uncomplicated: Secondary | ICD-10-CM

## 2021-01-12 DIAGNOSIS — E049 Nontoxic goiter, unspecified: Secondary | ICD-10-CM

## 2021-01-31 ENCOUNTER — Ambulatory Visit (HOSPITAL_COMMUNITY): Payer: No Typology Code available for payment source

## 2021-01-31 ENCOUNTER — Encounter (HOSPITAL_COMMUNITY): Payer: Self-pay

## 2021-02-06 ENCOUNTER — Other Ambulatory Visit: Payer: Self-pay

## 2021-02-06 ENCOUNTER — Ambulatory Visit (HOSPITAL_COMMUNITY)
Admission: RE | Admit: 2021-02-06 | Discharge: 2021-02-06 | Disposition: A | Discharge status: Home / Self Care | Payer: No Typology Code available for payment source | Source: Ambulatory Visit

## 2021-02-06 DIAGNOSIS — E049 Nontoxic goiter, unspecified: Secondary | ICD-10-CM

## 2021-02-06 DIAGNOSIS — J4520 Mild intermittent asthma, uncomplicated: Secondary | ICD-10-CM | POA: Diagnosis present

## 2021-07-18 DIAGNOSIS — R932 Abnormal findings on diagnostic imaging of liver and biliary tract: Secondary | ICD-10-CM | POA: Insufficient documentation

## 2021-11-13 ENCOUNTER — Telehealth: Payer: Self-pay

## 2021-11-13 NOTE — Telephone Encounter (Signed)
Pt calling; has been given several meds for what she says was a bacterial infection; has not had a cycle in 41yrs; after using the gel she had a full blown 7-9d period; now feels like it's getting ready to come on again.  Could this be d/t the med; does she need to be seen?  [phone] ?

## 2021-11-14 ENCOUNTER — Other Ambulatory Visit (HOSPITAL_COMMUNITY)
Admission: RE | Admit: 2021-11-14 | Discharge: 2021-11-14 | Disposition: A | Discharge status: Home / Self Care | Payer: PRIVATE HEALTH INSURANCE | Source: Ambulatory Visit | Attending: Obstetrics and Gynecology | Admitting: Obstetrics and Gynecology

## 2021-11-14 ENCOUNTER — Ambulatory Visit (INDEPENDENT_AMBULATORY_CARE_PROVIDER_SITE_OTHER): Payer: PRIVATE HEALTH INSURANCE | Admitting: Obstetrics and Gynecology

## 2021-11-14 ENCOUNTER — Inpatient Hospital Stay (HOSPITAL_COMMUNITY): Admit: 2021-11-14 | Payer: PRIVATE HEALTH INSURANCE

## 2021-11-14 ENCOUNTER — Encounter: Payer: Self-pay | Admitting: Obstetrics and Gynecology

## 2021-11-14 VITALS — BP 122/70 | Ht 67.0 in | Wt 253.0 lb

## 2021-11-14 DIAGNOSIS — N950 Postmenopausal bleeding: Secondary | ICD-10-CM | POA: Insufficient documentation

## 2021-11-14 DIAGNOSIS — Z1231 Encounter for screening mammogram for malignant neoplasm of breast: Secondary | ICD-10-CM

## 2021-11-14 NOTE — Progress Notes (Signed)
? ? ?D, Harrison Mons, FNP ? ? ?Chief Complaint  ?Patient presents with  ? Vaginal Bleeding  ?  Pt had a period in march that lasted 7 days, before this cycle last one was 3+ years ago, moderate cramping  ? ? ?HPI: ?     Ms. Tara Wood is a 56 y.o. [phone] whose LMP was Patient's last menstrual period was 10/20/2021 (exact date)., presents today for PMB. No menses since 10/19-10/20 (pt unsure). Had 7 days light bleeding 3/23, with clots, mild dysmen. Feels crampy again now like going to start again but no bleeding today. Was being treated for UTI and BV prior to sx start. She is sex active, no pain/bleeding.  ?FH breast and ovar cancer in her mat aunt. Pt hasn't had recent mammo. No breast sx.  ? ?Patient Active Problem List  ? Diagnosis Date Noted  ? Prediabetes 09/23/2019  ? Class 2 severe obesity with serious comorbidity and body mass index (BMI) of 35.0 to 35.9 in adult [facility]) 08/14/2019  ? Diffuse myofascial pain syndrome 04/13/2019  ? Paresthesia and pain of right extremity 04/13/2019  ? Polyarthralgia 01/11/2019  ? Positive ANA (antinuclear antibody) 01/30/2018  ? Right foot pain 12/11/2017  ? Essential hypertension 02/05/2017  ? RAD (reactive airway disease), mild intermittent, uncomplicated 12/26/2016  ? Microscopic hematuria 02/01/2014  ? 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 ? ?Past Surgical History:  ?Procedure Laterality Date  ? TUBAL LIGATION    ? ? ?Family History  ?Problem Relation Age of Onset  ? Heart failure Mother   ? Parkinson's disease Mother   ? Heart disease Mother   ? Stroke Father   ? High blood pressure Father   ? Hypertension Father   ? Breast cancer Maternal Aunt   ?     lat eyears  ? Ovarian cancer Maternal Aunt   ?     not sure of age  ? ? ?Social History  ? ?Socioeconomic History  ? Marital status: Married  ?  Spouse name: De Nurse  ? Number of children: Not on file  ? Years of education: Not on file  ? Highest education level: Not on file  ?Occupational History  ?  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 Smoking status: Never  ? Smokeless tobacco: Never  ?Vaping Use  ? Vaping Use: Never used  ?Substance and Sexual Activity  ? Alcohol use: Never  ? Drug use: Never  ? Sexual activity: Yes  ?  Birth control/protection: None  ?Other Topics Concern  ? Not on file  ?Social History Narrative  ? Not on file  ? ?Social Determinants of Health  ? ?Financial Resource Strain: Not on file  ?Food Insecurity: Not on file  ?Transportation Needs: Not on file  ?Physical Activity: Not on file  ?Stress: Not on file  ?Social Connections: Not on file  ?Intimate Partner Violence: Not on file  ? ? ?Outpatient Medications Prior to Visit  ?Medication Sig Dispense Refill  ? Cholecalciferol (VITAMIN D3) 125 MCG (5000 UT) TABS Take by mouth.    ? cyanocobalamin 1000 MCG tablet Take 1,000 mcg by mouth daily.    ? levalbuterol (XOPENEX HFA) 45 MCG/ACT inhaler Inhale into the lungs.    ? loratadine (CLARITIN) 10 MG tablet Take by mouth.    ? spironolactone (ALDACTONE) 25 MG tablet Take 1 tablet (25 mg total) by mouth daily. 30 tablet 0  ? triamcinolone ointment (KENALOG) 0.1 % Apply topically 2 (  two) times daily.    ? pantoprazole (PROTONIX) 40 MG tablet Take by mouth.    ? ?No facility-administered medications prior to visit.  ? ? ? ? ?ROS: ? ?Review of Systems  ?Constitutional:  Negative for fever.  ?Gastrointestinal:  Negative for blood in stool, constipation, diarrhea, nausea and vomiting.  ?Genitourinary:  Positive for vaginal bleeding. Negative for dyspareunia, dysuria, flank pain, frequency, hematuria, urgency, vaginal discharge and vaginal pain.  ?Musculoskeletal:  Negative for back pain.  ?Skin:  Negative for rash.  ?BREAST: No symptoms ? ? ?OBJECTIVE:  ? ?Vitals:  ?BP 122/70   Ht 5\' 7"  (1.702 m)   Wt 253 lb (114.8 kg)   LMP 10/20/2021 (Exact Date)   BMI 39.63 kg/m?  ? ?Physical Exam ?Vitals reviewed.  ?Constitutional:   ?   Appearance: She is well-developed.   ?Pulmonary:  ?   Effort: Pulmonary effort is normal.  ?Genitourinary: ?   General: Normal vulva.  ?   Pubic Area: No rash.   ?   Labia:     ?   Right: No rash, tenderness or lesion.     ?   Left: No rash, tenderness or lesion.   ?   Vagina: Normal. No vaginal discharge, erythema or tenderness.  ?   Cervix: Normal.  ?   Uterus: Normal. Not enlarged and not tender.   ?   Adnexa: Right adnexa normal and left adnexa normal.    ?   Right: No mass or tenderness.      ?   Left: No mass or tenderness.    ?Musculoskeletal:     ?   General: Normal range of motion.  ?   Cervical back: Normal range of motion.  ?Skin: ?   General: Skin is warm and dry.  ?Neurological:  ?   General: No focal deficit present.  ?   Mental Status: She is alert and oriented to person, place, and time.  ?Psychiatric:     ?   Mood and Affect: Mood normal.     ?   Behavior: Behavior normal.     ?   Thought Content: Thought content normal.     ?   Judgment: Judgment normal.  ? ? ?Endometrial Biopsy ?After discussion with the patient regarding her abnormal uterine bleeding I recommended that she proceed with an endometrial biopsy for further diagnosis. The risks, benefits, alternatives, and indications for an endometrial biopsy were discussed with the patient in detail. She understood the risks including infection, bleeding.  Verbal consent was obtained.  ? ?PROCEDURE NOTE:  P[place]elle endometrial biopsy was performed using aseptic technique with iodine preparation.  ?The uterus was sounded to a length of 9.0 cm.  Adequate sampling was obtained with minimal blood loss.  The patient tolerated the procedure well.  Disposition will be pending pathology. ? ?Assessment/Plan: ?PMB (postmenopausal bleeding) - Plan: Surgical pathology, 10/22/2021 PELVIS TRANSVAGINAL NON-OB (TV ONLY); 1 epsisode 3/23. EMB today, check GYN u/s. Will f/u with results and mgmt.  ? ?Encounter for screening mammogram for malignant neoplasm of breast - Plan: MM 3D SCREEN BREAST BILATERAL; pt to  schedule mammo.  ? ? ? Return in about 2 weeks (around 11/28/2021) for GYN u/s at [facility] for PMB--[facility] to call pt. ? ?Palin Tristan B. Arnie Maiolo, PA-C ?11/15/2021 ?11:40 AM ? ? ? ? ? ?

## 2021-11-14 NOTE — Telephone Encounter (Signed)
Patient is scheduled for 11/14/21 with [facility]

## 2021-11-15 ENCOUNTER — Other Ambulatory Visit (INDEPENDENT_AMBULATORY_CARE_PROVIDER_SITE_OTHER): Payer: PRIVATE HEALTH INSURANCE

## 2021-11-15 ENCOUNTER — Other Ambulatory Visit: Payer: Self-pay | Admitting: Obstetrics and Gynecology

## 2021-11-15 DIAGNOSIS — N950 Postmenopausal bleeding: Secondary | ICD-10-CM | POA: Diagnosis not present

## 2021-11-16 ENCOUNTER — Encounter: Payer: Self-pay | Admitting: Obstetrics and Gynecology

## 2021-11-16 LAB — SURGICAL PATHOLOGY

## 2021-12-21 ENCOUNTER — Ambulatory Visit
Admission: RE | Admit: 2021-12-21 | Discharge: 2021-12-21 | Disposition: A | Discharge status: Home / Self Care | Payer: PRIVATE HEALTH INSURANCE | Source: Ambulatory Visit | Attending: Obstetrics and Gynecology | Admitting: Obstetrics and Gynecology

## 2021-12-21 ENCOUNTER — Encounter: Payer: Self-pay | Admitting: Radiology

## 2021-12-21 DIAGNOSIS — Z1231 Encounter for screening mammogram for malignant neoplasm of breast: Secondary | ICD-10-CM | POA: Insufficient documentation

## 2022-03-06 ENCOUNTER — Encounter (INDEPENDENT_AMBULATORY_CARE_PROVIDER_SITE_OTHER): Payer: Self-pay

## 2022-03-12 IMAGING — CR DG CHEST 2V
2 series · 2 of 2 positions shown · non-contrast
Comparison: None.

CLINICAL DATA: Shortness of breath.

EXAM:
CHEST - 2 VIEW

[chest pa]
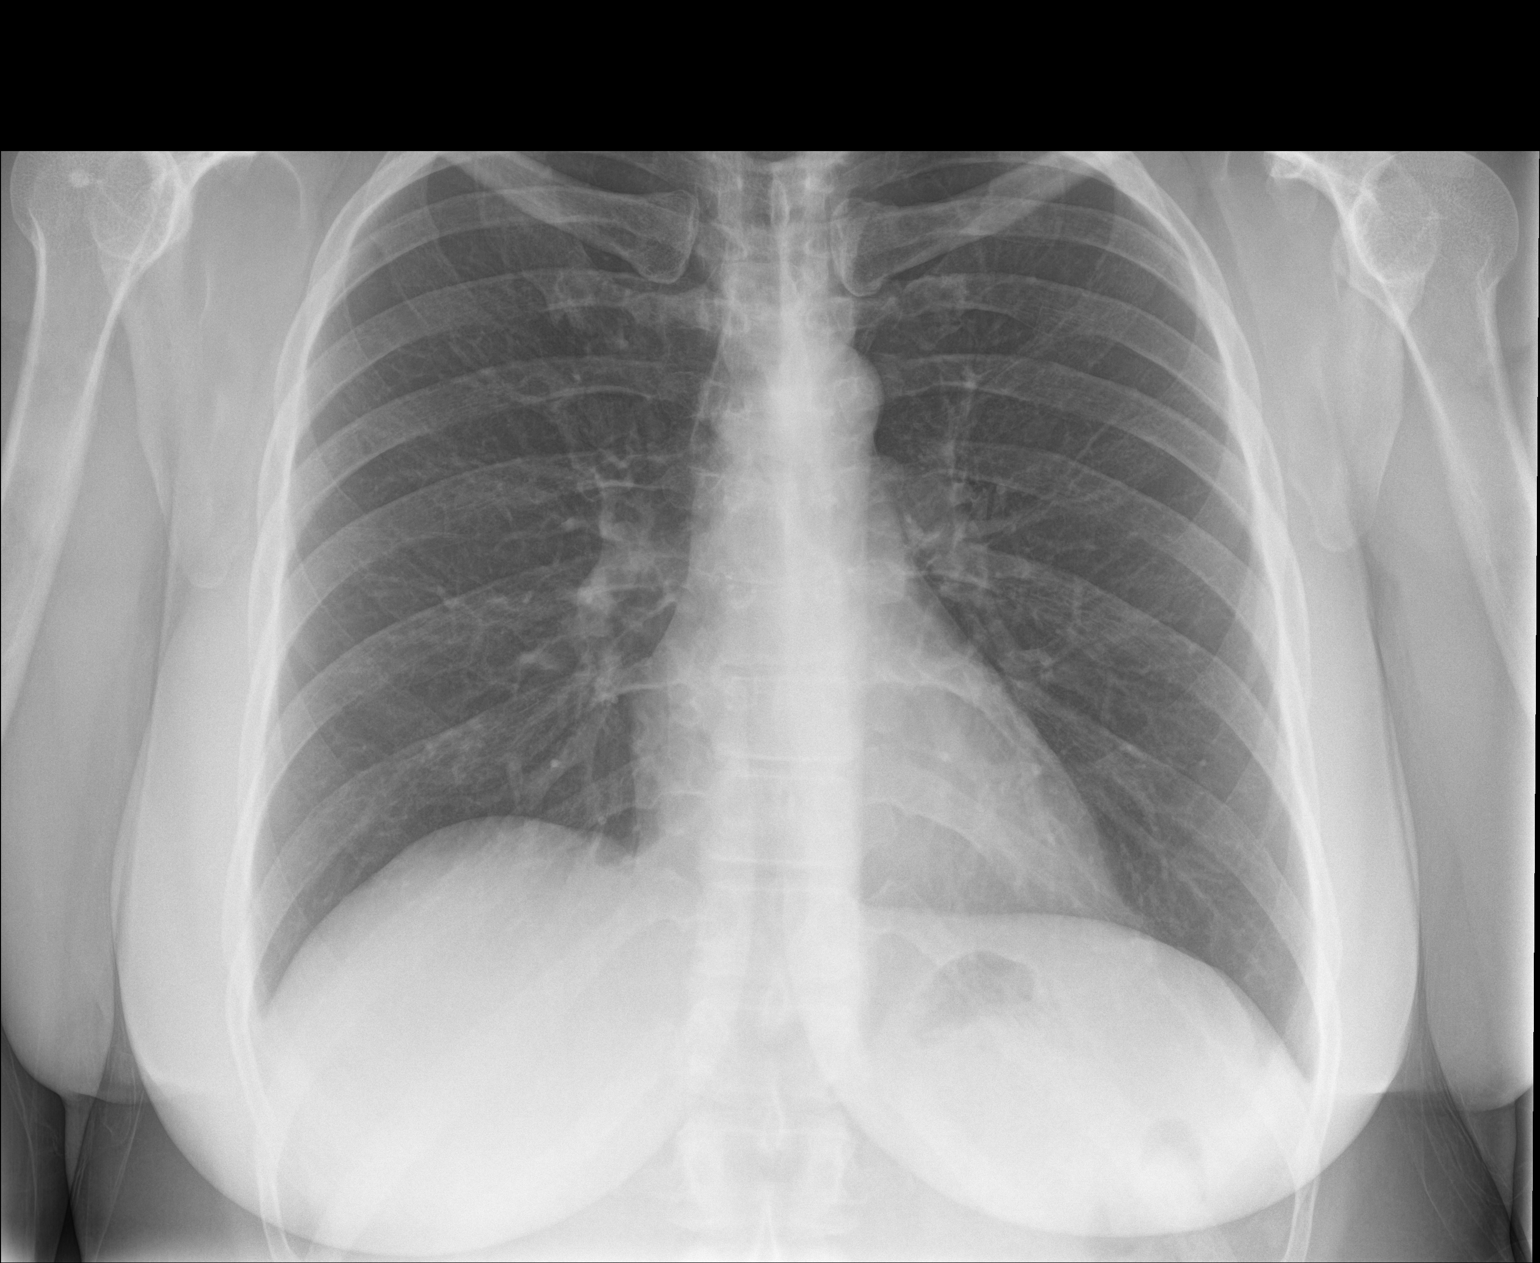

[chest lat]
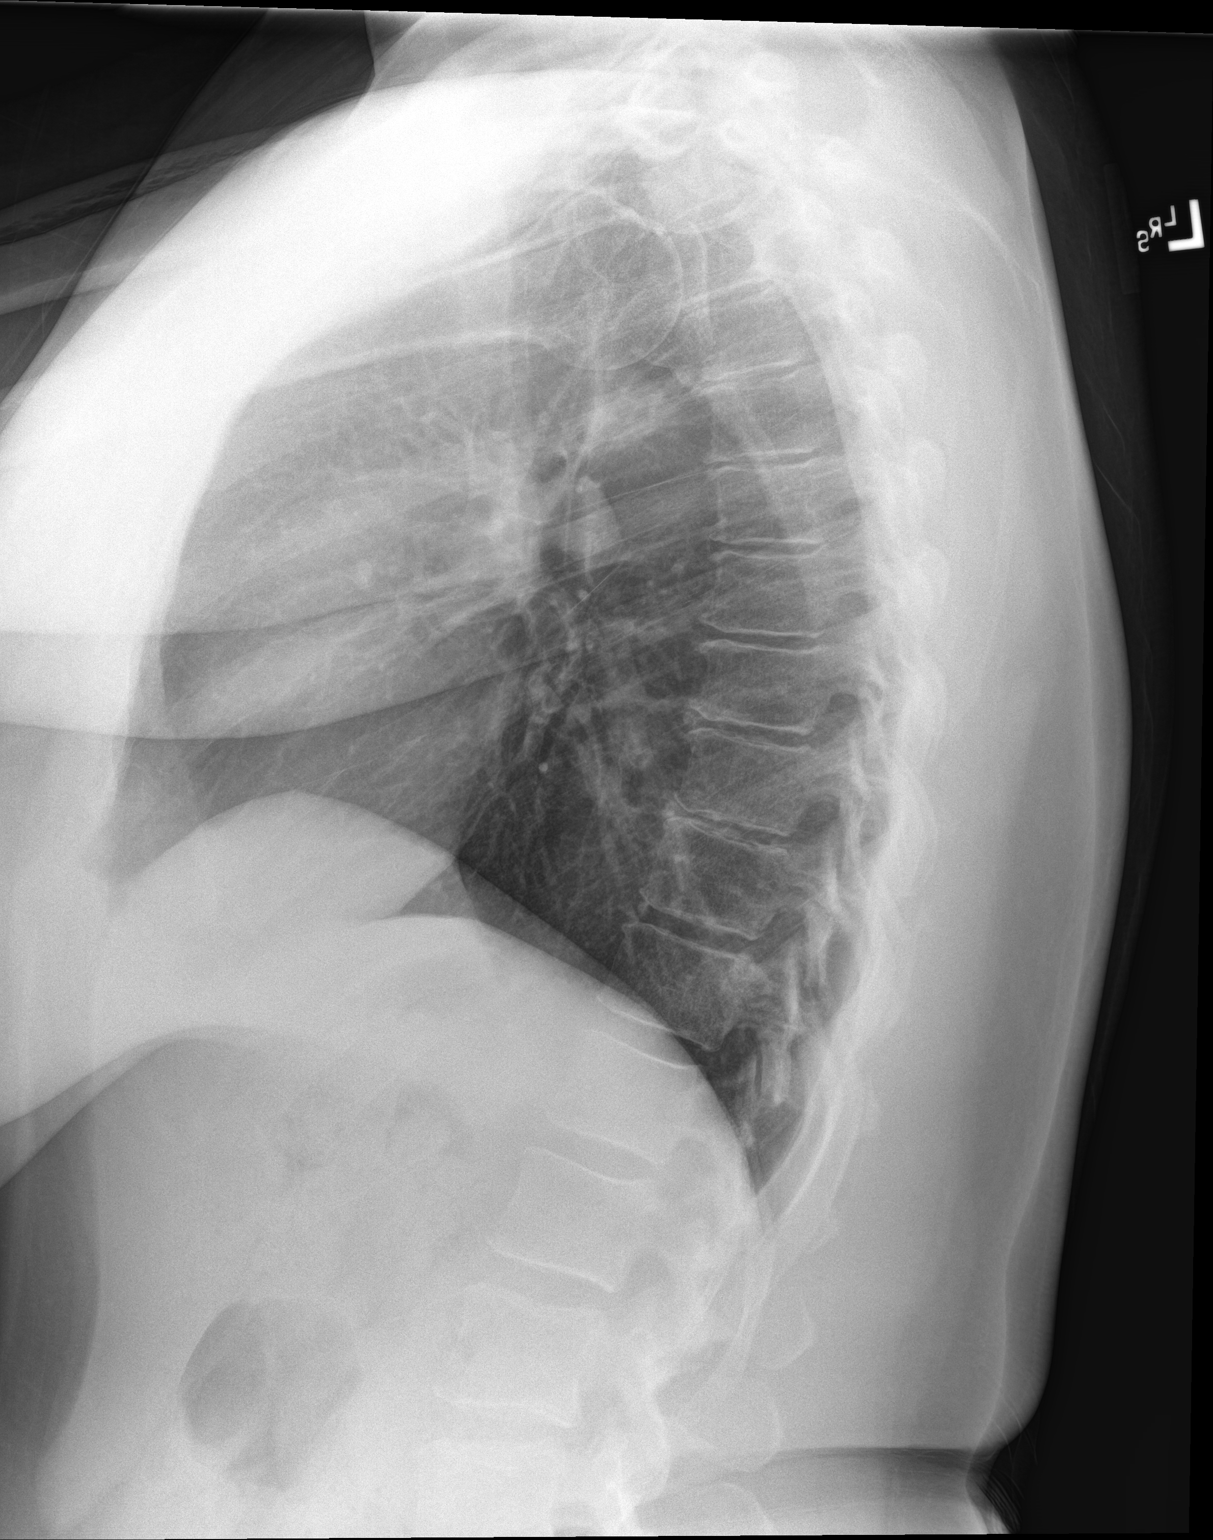

[2 of 2 positions shown; findings below may reference images not displayed]

FINDINGS: Lateral view degraded by patient arm position.

Midline trachea.  Normal heart size and mediastinal contours.

Sharp costophrenic angles.  No pneumothorax.  Clear lungs.
IMPRESSION: Normal chest.

## 2022-03-13 ENCOUNTER — Ambulatory Visit (INDEPENDENT_AMBULATORY_CARE_PROVIDER_SITE_OTHER): Payer: PRIVATE HEALTH INSURANCE

## 2022-03-13 ENCOUNTER — Ambulatory Visit (INDEPENDENT_AMBULATORY_CARE_PROVIDER_SITE_OTHER): Payer: PRIVATE HEALTH INSURANCE | Admitting: Podiatry

## 2022-03-13 DIAGNOSIS — M778 Other enthesopathies, not elsewhere classified: Secondary | ICD-10-CM

## 2022-03-13 DIAGNOSIS — S93691A Other sprain of right foot, initial encounter: Secondary | ICD-10-CM | POA: Diagnosis not present

## 2022-03-13 DIAGNOSIS — M66871 Spontaneous rupture of other tendons, right ankle and foot: Secondary | ICD-10-CM

## 2022-03-13 NOTE — Progress Notes (Signed)
Subjective:  Patient ID: Tara Wood, female    DOB: [birth year],  MRN: [id number] HPI Chief Complaint  Patient presents with   Foot Pain    Forefoot/plantar heel bilateral (R>L) - onset of issues: 2018 - initially whole lower extremity was swollen, then developed pain in the heels, went to see podiatrist, neurologist, rheumatologist, orthopedist, PCP-thought could have gout-negative, wears compression socks, developed small knot 1st MPJ left after wearing a pair of heels, swelling intermittent severity, friend comes here and thought she would try another opinion.    56 y.o. female presents with the above complaint.   ROS: Denies fever chills nausea vomiting muscle aches pains calf pain back pain chest pain shortness of breath.  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   Myriad MyRisk    Obesity    Palpitations    PVC (premature ventricular contraction)    Reactive airway disease    SOB (shortness of breath)    Swallowing difficulty    Swelling of right foot    Vitamin D deficiency    Past Surgical History:  Procedure Laterality Date   TUBAL LIGATION      Current Outpatient Medications:    Cholecalciferol (VITAMIN D3) 125 MCG (5000 UT) TABS, Take by mouth., Disp: , Rfl:    cyanocobalamin 1000 MCG tablet, Take 1,000 mcg by mouth daily., Disp: , Rfl:    estradiol (ESTRACE) 0.1 MG/GM vaginal cream, Place 1 g vaginally daily., Disp: , Rfl:    loratadine (CLARITIN) 10 MG tablet, Take by mouth., Disp: , Rfl:    spironolactone (ALDACTONE) 25 MG tablet, Take 1 tablet (25 mg total) by mouth daily., Disp: 30 tablet, Rfl: 0   triamcinolone ointment (KENALOG) 0.1 %, Apply topically 2 (two) times daily., Disp: , Rfl:   Allergies  Allergen Reactions   Other Hives   Gramineae Pollens Other (See Comments)    Needs inhaler   Review of  Systems Objective:  There were no vitals filed for this visit.  General: Well developed, nourished, in no acute distress, alert and oriented x3   Dermatological: Skin is warm, dry and supple bilateral. Nails x 10 are well maintained; remaining integument appears unremarkable at this time. There are no open sores, no preulcerative lesions, no rash or signs of infection present.  She does have a dermopathy or brawny edema with hyperpigmentation right lower extremity and dorsum of the foot and ankle.  Vascular: Dorsalis Pedis artery and Posterior Tibial artery pedal pulses are 2/4 bilateral with immedate capillary fill time. Pedal hair growth present. No varicosities and   Edema to the right lower extremity nonpitting in nature minimal to the left.  Neruologic: Grossly intact via light touch bilateral. Vibratory intact via tuning fork bilateral. Protective threshold with Semmes Wienstein monofilament intact to all pedal sites bilateral. Patellar and Achilles deep tendon reflexes 2+ bilateral. No Babinski or clonus noted bilateral.   Musculoskeletal: No gross boney pedal deformities bilateral. No pain, crepitus, or limitation noted with foot and ankle range of motion bilateral. Muscular strength 5/5 in all groups tested bilateral.  Pain on palpation medial calcaneal tubercles bilateral.  She does have significant pain on palpation of the posterior tibial tendon of the right foot with a hypertrophic navicular tuberosity.  Gait: Unassisted, Nonantalgic.    Radiographs:  Radiographs taken today demonstrate mild  pes planovalgus bilaterally no significant osseous abnormalities other than plantar distally oriented calcaneal spur left greater than right with soft tissue increase in density at the plantar fascial Caney insertion site.  Assessment & Plan:   Assessment: Cannot rule out lymphedema to the right lower extremity.  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   Posterior tibial tendinitis right foot.  Possible tear  Plan: Discussed etiology pathology conservative versus surgical therapies.  Since this has been going on since 2018 and she has been to visit multiple specialties including orthopedics rheumatology neurology and vascular I feel that an MRI to the right lower extremity rear foot is necessary.  This is affecting her ability to perform her daily activities maintain her general good health and enjoy life.  We are evaluating this for tear of the plantar fascia and for possible tear of the posterior tibial tendon.  I will follow-up with her once her MRI is complete.     Loring Liskey T. Herrings, [place]

## 2022-04-02 ENCOUNTER — Ambulatory Visit
Admission: RE | Admit: 2022-04-02 | Discharge: 2022-04-02 | Disposition: A | Discharge status: Home / Self Care | Payer: PRIVATE HEALTH INSURANCE | Source: Ambulatory Visit | Attending: Podiatry | Admitting: Podiatry

## 2022-04-02 DIAGNOSIS — M66871 Spontaneous rupture of other tendons, right ankle and foot: Secondary | ICD-10-CM

## 2022-04-02 DIAGNOSIS — S93691A Other sprain of right foot, initial encounter: Secondary | ICD-10-CM

## 2022-04-09 ENCOUNTER — Telehealth: Payer: Self-pay | Admitting: Podiatry

## 2022-04-09 NOTE — Telephone Encounter (Signed)
LVM to sched appt for MRI f/u

## 2022-04-09 NOTE — Telephone Encounter (Signed)
-----   Message from Benn Moulder sent at 04/09/2022 11:29 AM EDT ----- Willaim Rayas can you call this patient and get him scheduled with Dr. Al Corpus  ----- Message ----- From: Elinor Parkinson, [place] Sent: 04/08/2022   8:08 AM EDT To: Benn Moulder  You can have him in to discuss Plantar fasciitis.  No tear.

## 2022-04-22 ENCOUNTER — Encounter: Payer: Self-pay | Admitting: Podiatry

## 2022-04-22 ENCOUNTER — Ambulatory Visit (INDEPENDENT_AMBULATORY_CARE_PROVIDER_SITE_OTHER): Payer: PRIVATE HEALTH INSURANCE | Admitting: Podiatry

## 2022-04-22 DIAGNOSIS — M66871 Spontaneous rupture of other tendons, right ankle and foot: Secondary | ICD-10-CM | POA: Diagnosis not present

## 2022-04-22 NOTE — Progress Notes (Signed)
She presents today for follow-up of her MRI.  She states that really nothing is changed.  Objective: Vital signs are stable alert oriented x3.  Pulses are palpable.  Still has moderate pain on palpation medial calcaneal tubercles bilateral posterior tibial tendon particularly right over left.  She has very prominent navicular tuberosity with exquisite pain.  MRI demonstrates only medial band plantar fasciitis.  Assessment: Planter fasciitis posterior tibial tendinitis.  Plan: I feel that the smartest thing to do at this point is to get her some orthotics made.  She will have orthotics casted Friday and I will write that prescription.  Follow-up with her once those come in.

## 2022-04-26 ENCOUNTER — Encounter: Payer: Self-pay | Admitting: Podiatry

## 2022-04-26 ENCOUNTER — Ambulatory Visit (INDEPENDENT_AMBULATORY_CARE_PROVIDER_SITE_OTHER): Payer: PRIVATE HEALTH INSURANCE | Admitting: Podiatry

## 2022-04-26 DIAGNOSIS — S93691A Other sprain of right foot, initial encounter: Secondary | ICD-10-CM

## 2022-04-26 DIAGNOSIS — M778 Other enthesopathies, not elsewhere classified: Secondary | ICD-10-CM | POA: Diagnosis not present

## 2022-04-26 NOTE — Progress Notes (Signed)
Patient presents today to be casted for custom molded orthotics. Dr. Milinda Pointer has been treating patient for Plantar Fasciitis and Capsulitis.   Impression foam cast was taken. DME form was signed.  Patient info-  Shoe size: 64 M  Weight: 253 lbs  Insurance: [employer]   Patient will be notified once orthotics arrive in office and reappoint for fitting at that time.

## 2022-06-11 ENCOUNTER — Encounter: Payer: PRIVATE HEALTH INSURANCE | Admitting: Podiatry

## 2022-06-17 ENCOUNTER — Telehealth: Payer: Self-pay | Admitting: Podiatry

## 2022-06-17 MED ORDER — CELECOXIB 100 MG PO CAPS: 100.0000 mg | ORAL_CAPSULE | Freq: Two times a day (BID) | ORAL | 3 refills | Status: DC

## 2022-06-17 MED ORDER — METHYLPREDNISOLONE 4 MG PO TBPK: ORAL_TABLET | ORAL | 0 refills | Status: DC

## 2022-06-17 NOTE — Telephone Encounter (Signed)
Pt said her feet feels like knives in her feet and wanting some pain medication or topical to put on her feet to help with pain.  Please advice.

## 2022-06-17 NOTE — Telephone Encounter (Signed)
Sent in Celebrex and Medrol Dose pack

## 2022-06-18 ENCOUNTER — Ambulatory Visit (INDEPENDENT_AMBULATORY_CARE_PROVIDER_SITE_OTHER): Payer: PRIVATE HEALTH INSURANCE

## 2022-06-18 DIAGNOSIS — M66871 Spontaneous rupture of other tendons, right ankle and foot: Secondary | ICD-10-CM

## 2022-06-18 NOTE — Progress Notes (Signed)
Patient presents today to pick up custom molded foot orthotics, diagnosed with tibialis posterior tendon tear and rupture of plantar fasica by Dr. Al Corpus.   Orthotics were dispensed and fit was satisfactory. Reviewed instructions for break-in and wear. Written instructions given to patient.  Patient will follow up as needed.   Olivia Mackie Lab - order # [id number]

## 2022-06-18 NOTE — Telephone Encounter (Signed)
Patient has been updated that medication was sent to pharmacy on file.

## 2022-07-01 ENCOUNTER — Encounter: Payer: Self-pay | Admitting: Podiatry

## 2022-07-01 ENCOUNTER — Ambulatory Visit (INDEPENDENT_AMBULATORY_CARE_PROVIDER_SITE_OTHER): Payer: PRIVATE HEALTH INSURANCE | Admitting: Podiatry

## 2022-07-01 DIAGNOSIS — S93691D Other sprain of right foot, subsequent encounter: Secondary | ICD-10-CM

## 2022-07-01 DIAGNOSIS — M66871 Spontaneous rupture of other tendons, right ankle and foot: Secondary | ICD-10-CM | POA: Diagnosis not present

## 2022-07-01 MED ORDER — MELOXICAM 15 MG PO TABS: 15.0000 mg | ORAL_TABLET | Freq: Every day | ORAL | 3 refills | Status: DC

## 2022-07-01 NOTE — Progress Notes (Signed)
She presents today for follow-up of her posterior tibial tendon dysfunction and her Planter fasciitis.  States that the orthotics seem to help some but she still has a lot of pain in her feet.  Objective: Vital signs are stable alert and oriented x 3.  Majority of her pain is in the right foot just around the navicular and the navicular cuneiform joint.  She still has plantar fascial pain as well.  In much decrease in tenderness along the posterior tibial tendon at this point.  Assessment: Resolving posterior tibial tendinitis Planter fasciitis is still present.  Plan: At this point I recommended that she continue the use of the orthotics we discussed appropriate shoes and she will start meloxicam 15 mg 1 p.o. daily.

## 2022-08-14 ENCOUNTER — Ambulatory Visit: Payer: PRIVATE HEALTH INSURANCE | Admitting: Podiatry

## 2022-09-11 ENCOUNTER — Emergency Department
Admission: EM | Admit: 2022-09-11 | Discharge: 2022-09-11 | Disposition: A | Discharge status: Home / Self Care | Payer: PRIVATE HEALTH INSURANCE | Attending: Emergency Medicine | Admitting: Emergency Medicine

## 2022-09-11 ENCOUNTER — Emergency Department: Payer: PRIVATE HEALTH INSURANCE

## 2022-09-11 ENCOUNTER — Other Ambulatory Visit: Payer: Self-pay

## 2022-09-11 DIAGNOSIS — K922 Gastrointestinal hemorrhage, unspecified: Secondary | ICD-10-CM | POA: Insufficient documentation

## 2022-09-11 DIAGNOSIS — R7401 Elevation of levels of liver transaminase levels: Secondary | ICD-10-CM | POA: Insufficient documentation

## 2022-09-11 DIAGNOSIS — R1030 Lower abdominal pain, unspecified: Secondary | ICD-10-CM | POA: Diagnosis present

## 2022-09-11 LAB — COMPREHENSIVE METABOLIC PANEL
ALT: 50 U/L — ABNORMAL HIGH (ref 0–44)
AST: 43 U/L — ABNORMAL HIGH (ref 15–41)
Albumin: 4 g/dL (ref 3.5–5.0)
Alkaline Phosphatase: 80 U/L (ref 38–126)
Anion gap: 10 (ref 5–15)
BUN: 13 mg/dL (ref 6–20)
CO2: 24 mmol/L (ref 22–32)
Calcium: 9.5 mg/dL (ref 8.9–10.3)
Chloride: 106 mmol/L (ref 98–111)
Creatinine, Ser: 0.76 mg/dL (ref 0.44–1.00)
GFR, Estimated: >60 mL/min (ref >60)
Glucose, Bld: 100 mg/dL — ABNORMAL HIGH (ref 70–99)
Potassium: 3.9 mmol/L (ref 3.5–5.1)
Sodium: 140 mmol/L (ref 135–145)
Total Bilirubin: 0.9 mg/dL (ref 0.3–1.2)
Total Protein: 8 g/dL (ref 6.5–8.1)

## 2022-09-11 LAB — URINALYSIS, ROUTINE W REFLEX MICROSCOPIC
Bacteria, UA: NONE SEEN
Bilirubin Urine: NEGATIVE
Glucose, UA: NEGATIVE mg/dL
Ketones, ur: NEGATIVE mg/dL
Leukocytes,Ua: NEGATIVE
Nitrite: NEGATIVE
Protein, ur: NEGATIVE mg/dL
Specific Gravity, Urine: 1.024 (ref 1.005–1.030)
pH: 5 (ref 5.0–8.0)

## 2022-09-11 LAB — CBC WITH DIFFERENTIAL/PLATELET
Abs Immature Granulocytes: 0.03 10*3/uL (ref 0.00–0.07)
Basophils Absolute: 0.1 10*3/uL (ref 0.0–0.1)
Basophils Relative: 1 %
Eosinophils Absolute: 0.2 10*3/uL (ref 0.0–0.5)
Eosinophils Relative: 2 %
HCT: 43.8 % (ref 36.0–46.0)
Hemoglobin: 14.2 g/dL (ref 12.0–15.0)
Immature Granulocytes: 0 %
Lymphocytes Relative: 40 %
Lymphs Abs: 3.8 10*3/uL (ref 0.7–4.0)
MCH: 27 pg (ref 26.0–34.0)
MCHC: 32.4 g/dL (ref 30.0–36.0)
MCV: 83.3 fL (ref 80.0–100.0)
Monocytes Absolute: 0.8 10*3/uL (ref 0.1–1.0)
Monocytes Relative: 8 %
Neutro Abs: 4.7 10*3/uL (ref 1.7–7.7)
Neutrophils Relative %: 49 %
Platelets: 383 10*3/uL (ref 150–400)
RBC: 5.26 MIL/uL — ABNORMAL HIGH (ref 3.87–5.11)
RDW: 14.3 % (ref 11.5–15.5)
WBC: 9.5 10*3/uL (ref 4.0–10.5)
nRBC: 0 % (ref 0.0–0.2)

## 2022-09-11 MED ORDER — PANTOPRAZOLE SODIUM 40 MG IV SOLR
40.0000 mg | Freq: Once | INTRAVENOUS | Status: AC
  Administered 2022-09-11: 40 mg via INTRAVENOUS
  Filled 2022-09-11: qty 10

## 2022-09-11 MED ORDER — IOHEXOL 350 MG/ML SOLN
100.0000 mL | Freq: Once | INTRAVENOUS | Status: AC | PRN
  Administered 2022-09-11: 100 mL via INTRAVENOUS

## 2022-09-11 MED ORDER — PANTOPRAZOLE SODIUM 20 MG PO TBEC: 20.0000 mg | DELAYED_RELEASE_TABLET | Freq: Every day | ORAL | 0 refills | Status: DC

## 2022-09-11 NOTE — ED Notes (Signed)
Pt states seen yesterday for sob and weakness at the primary care doctor

## 2022-09-11 NOTE — Discharge Instructions (Addendum)
Take Protonix once daily.  Please make follow-up with GI, Dr. Marius Ditch Start stool softener if you notice hard stools.

## 2022-09-11 NOTE — ED Provider Notes (Signed)
[facility] Provider Note  Patient Contact: 3:20 PM (approximate)   History   Blood In Stools (Patient is here today for concerns of 2 episodes of blood in her stool earlier today; She states that this has happened to her once before yearssssss ago but was unable to determine the cause (she does report internal hemorrhoids))   HPI  Tara Wood is a 57 y.o. female with a history of PVCs, palpitations, and prior lower GI bleed in 1997, presents to the emergency department with lower abdominal "aching" that feels like hunger and a pulling sensation in her abdomen.  Patient states that she had 2 bowel movements today and afterwards had a copious amount of blood in the toilet.  She denies rectal pain or anal itching.  She states that she had similar symptoms in 1997 and had both a colonoscopy and endoscopy and states that her care team could not come up with a reason for bleeding.  Patient has not had a colonoscopy since that time and I am unable to pull up the results in her medical chart.  Patient denies excessive use of anti-inflammatories or alcohol.  She has not started any new medications.  No falls or mechanisms of trauma.  Patient denies hematemesis.  No chest pain, chest tightness or shortness of breath.  No fever noted at home.      Physical Exam   Triage Vital Signs: ED Triage Vitals  Enc Vitals Group     BP 09/11/22 1432 (!) 144/91     Pulse Rate 09/11/22 1432 (!) 103     Resp 09/11/22 1432 19     Temp 09/11/22 1432 98.6 F (37 C)     Temp Source 09/11/22 1432 Oral     SpO2 09/11/22 1432 95 %     Weight 09/11/22 1430 252 lb (114.3 kg)     Height 09/11/22 1430 5' 7"$  (1.702 m)     Head Circumference --      Peak Flow --      Pain Score 09/11/22 1430 2     Pain Loc --      Pain Edu? --      Excl. in [place]? --     Most recent vital signs: Vitals:   09/11/22 1432 09/11/22 1728  BP: (!) 144/91 117/75  Pulse: (!) 103 77  Resp: 19 19   Temp: 98.6 F (37 C) 98.4 F (36.9 C)  SpO2: 95% 100%     General: Alert and in no acute distress. Eyes:  PERRL. EOMI. Head: No acute traumatic findings ENT:      Nose: No congestion/rhinnorhea.      Mouth/Throat: Mucous membranes are moist.  Neck: No stridor. No cervical spine tenderness to palpation. Cardiovascular:  Good peripheral perfusion Respiratory: Normal respiratory effort without tachypnea or retractions. Lungs CTAB. Good air entry to the bases with no decreased or absent breath sounds. Gastrointestinal: Bowel sounds 4 quadrants. Soft and nontender to palpation. No guarding or rigidity. No palpable masses. No distention. No CVA tenderness. Musculoskeletal: Full range of motion to all extremities.  Neurologic:  No gross focal neurologic deficits are appreciated.  Skin:   No rash noted   ED Results / Procedures / Treatments   Labs (all labs ordered are listed, but only abnormal results are displayed) Labs Reviewed  CBC WITH DIFFERENTIAL/PLATELET - Abnormal; Notable for the following components:      Result Value   RBC 5.26 (*)    All  other components within normal limits  COMPREHENSIVE METABOLIC PANEL - Abnormal; Notable for the following components:   Glucose, Bld 100 (*)    AST 43 (*)    ALT 50 (*)    All other components within normal limits  URINALYSIS, ROUTINE W REFLEX MICROSCOPIC - Abnormal; Notable for the following components:   Color, Urine YELLOW (*)    APPearance CLEAR (*)    Hgb urine dipstick MODERATE (*)    All other components within normal limits        RADIOLOGY  I personally viewed and evaluated these images as part of my medical decision making, as well as reviewing the written report by the radiologist.  ED Provider Interpretation: No acute abnormality on CT angio GI bleed.   PROCEDURES:  Critical Care performed: No  Procedures   MEDICATIONS ORDERED IN ED: Medications  pantoprazole (PROTONIX) injection 40 mg (40 mg  Intravenous Given 09/11/22 1603)  iohexol (OMNIPAQUE) 350 MG/ML injection 100 mL (100 mLs Intravenous Contrast Given 09/11/22 1614)     IMPRESSION / MDM / ASSESSMENT AND PLAN / ED COURSE  I reviewed the triage vital signs and the nursing notes.                              Assessment and plan Lower GI bleed 57 year old female presents to the emergency department with complaint of bright red blood after 2 bowel movements today.  Vital signs are reassuring at triage.  On exam, patient was alert and nontoxic-appearing.  She was able to ambulate easily from exam room chair to exam table.  Her abdomen was soft and nontender and there was no apparent guarding.  Differential diagnosis includes lower GI bleed, concomitant upper GI bleed, UTI, hemorrhoids, anal fissure, inflammatory bowel disease, mesenteric ischemia...  CMP indicated elevated AST and ALT but was otherwise reassuring.  CBC within range.  Patient's Hemoccult test was positive.  Given abdominal discomfort, will obtain CT angio for GI bleed and administer Protonix and will reassess.  CT angio reassuring.  I did discuss the importance of following up with GI for colonoscopy.  Given hemodynamic stability in the emergency department, I feel that patient is appropriate for outpatient follow-up.  Return precautions were given to return with new or worsening symptoms.  All patient questions were answered.  FINAL CLINICAL IMPRESSION(S) / ED DIAGNOSES   Final diagnoses:  Lower GI bleed     Rx / DC Orders   ED Discharge Orders          Ordered    pantoprazole (PROTONIX) 20 MG tablet  Daily        09/11/22 1716             Note:  This document was prepared using Dragon voice recognition software and may include unintentional dictation errors.   Vallarie Mare Spry, PA-C 09/11/22 2349    Duffy Bruce, MD 09/12/22 [phone]

## 2022-09-11 NOTE — ED Triage Notes (Signed)
Patient is here today for concerns of 2 episodes of blood in her stool earlier today; She states that this has happened to her once before yearssssss ago but was unable to determine the cause (she does report internal hemorrhoids)

## 2022-09-18 ENCOUNTER — Ambulatory Visit: Payer: PRIVATE HEALTH INSURANCE | Admitting: Podiatry

## 2022-10-02 ENCOUNTER — Ambulatory Visit (INDEPENDENT_AMBULATORY_CARE_PROVIDER_SITE_OTHER): Payer: PRIVATE HEALTH INSURANCE | Admitting: Podiatry

## 2022-10-02 ENCOUNTER — Encounter: Payer: Self-pay | Admitting: Podiatry

## 2022-10-02 DIAGNOSIS — M66871 Spontaneous rupture of other tendons, right ankle and foot: Secondary | ICD-10-CM

## 2022-10-02 MED ORDER — CELECOXIB 100 MG PO CAPS: 100.0000 mg | ORAL_CAPSULE | Freq: Two times a day (BID) | ORAL | 3 refills | Status: DC

## 2022-10-02 MED ORDER — METHYLPREDNISOLONE 4 MG PO TBPK: ORAL_TABLET | ORAL | 0 refills | Status: DC

## 2022-10-03 NOTE — Progress Notes (Signed)
She presents today for follow-up of her posterior tibial tendinitis right plan fasciitis bilateral.  She states at times it still bothers me quite a bit but for the most part they seem to be doing pretty well.  She states that she is probably about 50% improved.  Objective: Vital signs stable alert oriented x 3 still has tenderness on palpation to the posterior tibial tendon insertion on the navicular.  Appears to be hypertrophic navicular bone there.  She also has some tenderness on palpation medial calcaneal tubercle bilateral.  Assessment: Planter fasciitis bilateral resolving posterior tibial tendinitis right.  Plan: Start her on methylprednisolone to be followed by Celebrex 100 mg 1 p.o. twice daily #60 with 3 refills.

## 2022-10-24 ENCOUNTER — Other Ambulatory Visit: Payer: Self-pay | Admitting: Family Medicine

## 2022-10-24 DIAGNOSIS — Z1231 Encounter for screening mammogram for malignant neoplasm of breast: Secondary | ICD-10-CM

## 2022-12-05 ENCOUNTER — Ambulatory Visit: Payer: PRIVATE HEALTH INSURANCE | Admitting: Gastroenterology

## 2022-12-09 ENCOUNTER — Encounter: Payer: Self-pay | Admitting: Gastroenterology

## 2022-12-09 ENCOUNTER — Other Ambulatory Visit: Payer: Self-pay

## 2022-12-09 ENCOUNTER — Ambulatory Visit (INDEPENDENT_AMBULATORY_CARE_PROVIDER_SITE_OTHER): Payer: PRIVATE HEALTH INSURANCE | Admitting: Gastroenterology

## 2022-12-09 VITALS — BP 128/85 | HR 86 | Temp 98.3°F | Ht 67.0 in | Wt 248.5 lb

## 2022-12-09 DIAGNOSIS — K625 Hemorrhage of anus and rectum: Secondary | ICD-10-CM | POA: Diagnosis not present

## 2022-12-09 DIAGNOSIS — U071 COVID-19: Secondary | ICD-10-CM

## 2022-12-09 HISTORY — DX: COVID-19: U07.1

## 2022-12-09 MED ORDER — NA SULFATE-K SULFATE-MG SULF 17.5-3.13-1.6 GM/177ML PO SOLN: 354.0000 mL | Freq: Once | ORAL | 0 refills | Status: AC

## 2022-12-09 NOTE — Progress Notes (Signed)
Tara Repress, MD [address]  [address]  [place], [place] [address]  Main: [phone]  Fax: [phone]    Gastroenterology Consultation  Referring Provider:     Marland Kitchen, FNP Primary Care Physician:  Marland Kitchen, FNP Primary Gastroenterologist:  Dr. Arlyss Wood Reason for Consultation: Rectal bleeding        HPI:   Tara Wood is a 57 y.o. female referred by D, Harrison Mons, FNP  for consultation & management of rectal bleeding.  Patient was seen at [facility] ER in 2/24 secondary to painless bright red blood per rectum.  She underwent CT angio GI bleed protocol which was negative for active bleeding source.  Hemoglobin was normal.  Patient reports that in 1997, she had similar presentation, underwent EGD, colonoscopy as well as Meckel's scan which were unremarkable.  She reports that she has been experiencing intermittent episodes of fresh blood per rectum, dripping into the toilet bowel.  Her stools are generally soft, consumes fruits and vegetables, does not consume red meat.  She reports that she strains sometimes more than normal.  She denies any hard stools.  She denies any other rectal symptoms.  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   Myriad MyRisk    Obesity    Palpitations    PVC (premature ventricular contraction)    Reactive airway disease    SOB (shortness of breath)    Swallowing difficulty    Swelling of right foot    Vitamin D deficiency     Past Surgical History:  Procedure Laterality Date   TUBAL LIGATION       Current Outpatient Medications:    Cholecalciferol (VITAMIN D3) 125 MCG (5000 UT) TABS, Take by mouth., Disp: , Rfl:     cyanocobalamin 1000 MCG tablet, Take 1,000 mcg by mouth daily., Disp: , Rfl:    estradiol (ESTRACE) 0.1 MG/GM vaginal cream, Place 1 g vaginally daily., Disp: , Rfl:    loratadine (CLARITIN) 10 MG tablet, Take by mouth., Disp: , Rfl:    meloxicam (MOBIC) 15 MG tablet, Take 1 tablet (15 mg total) by mouth daily., Disp: 30 tablet, Rfl: 3   Na Sulfate-K Sulfate-Mg Sulf 17.5-3.13-1.6 GM/177ML SOLN, Take 354 mLs by mouth once for 1 dose., Disp: 354 mL, Rfl: 0   spironolactone (ALDACTONE) 25 MG tablet, Take 1 tablet (25 mg total) by mouth daily., Disp: 30 tablet, Rfl: 0   triamcinolone ointment (KENALOG) 0.1 %, Apply topically 2 (two) times daily., Disp: , Rfl:    Family History  Problem Relation Age of Onset   Heart failure Mother    Parkinson's disease Mother    Heart disease Mother    Stroke Father    High blood pressure Father    Hypertension Father    Breast cancer Maternal Aunt        lat eyears   Ovarian cancer Maternal Aunt        not sure of age     Social History   Tobacco Use   Smoking status: Never   Smokeless tobacco: Never  Vaping Use   Vaping Use: Never used  Substance Use Topics  Alcohol use: Never   Drug use: Never    Allergies as of 12/09/2022 - Review Complete 12/09/2022  Allergen Reaction Noted   Other Hives 08/01/2021   Gramineae pollens Other (See Comments) 08/01/2021    Review of Systems:    All systems reviewed and negative except where noted in HPI.   Physical Exam:  BP 128/85 (BP Location: Right Arm, Patient Position: Sitting, Cuff Size: Large)   Pulse 86   Temp 98.3 F (36.8 C) (Oral)   Ht 5\' 7"  (1.702 m)   Wt 248 lb 8 oz (112.7 kg)   LMP 10/20/2021 (Exact Date)   BMI 38.92 kg/m  Patient's last menstrual period was 10/20/2021 (exact date).  General:   Alert,  Well-developed, well-nourished, pleasant and cooperative in NAD Head:  Normocephalic and atraumatic. Eyes:  Sclera clear, no icterus.   Conjunctiva pink. Ears:  Normal auditory  acuity. Nose:  No deformity, discharge, or lesions. Mouth:  No deformity or lesions,oropharynx pink & moist. Neck:  Supple; no masses or thyromegaly. Lungs:  Respirations even and unlabored.  Clear throughout to auscultation.   No wheezes, crackles, or rhonchi. No acute distress. Heart:  Regular rate and rhythm; no murmurs, clicks, rubs, or gallops. Abdomen:  Normal bowel sounds. Soft, non-tender and non-distended without masses, hepatosplenomegaly or hernias noted.  No guarding or rebound tenderness.   Rectal: Not performed Msk:  Symmetrical without gross deformities. Good, equal movement & strength bilaterally. Pulses:  Normal pulses noted. Extremities:  No clubbing or edema.  No cyanosis. Neurologic:  Alert and oriented x3;  grossly normal neurologically. Skin:  Intact without significant lesions or rashes. No jaundice. Psych:  Alert and cooperative. Normal mood and affect.  Imaging Studies: Reviewed  Assessment and Plan:   [name] is a 57 y.o. African-American female with no significant past medical history, is seen in consultation for intermittent episodes of painless rectal bleeding  Recommend diagnostic colonoscopy.  If this is unremarkable, discussed about outpatient hemorrhoid ligation, information provided  Follow up after the colonoscopy results   Tara Repress, MD

## 2022-12-17 ENCOUNTER — Telehealth: Payer: Self-pay

## 2022-12-17 ENCOUNTER — Encounter: Payer: Self-pay | Admitting: Gastroenterology

## 2022-12-17 NOTE — Telephone Encounter (Signed)
Patient is okay with having colonoscopy but she did not ask off work so she needs to change the date. Reschedule to 01/16/2023 in [place] with [place]. Allegra Lai. Called [place] and talk to [place] and she will reschedule patient and change diagnosis. Sent out new instructions

## 2022-12-17 NOTE — Telephone Encounter (Signed)
We can change to screening, I'm ok with it  RV

## 2022-12-17 NOTE — Telephone Encounter (Signed)
Patient is calling because since the colonoscopy is schedule a a diagnostic colonoscopy she is having to pay 20 percent of the colonoscopy which 1200 dollars. She states that she can not afford this. If the colonoscopy was a screening it would be cover a 100 percent. Please advise if this can be change to a screening colonoscopy

## 2022-12-18 ENCOUNTER — Telehealth: Payer: Self-pay

## 2022-12-18 NOTE — Telephone Encounter (Signed)
[facility] will not do the procedure because patient will not have stress test done that PCP recommend. Please advise if you want you want me to do

## 2022-12-18 NOTE — Telephone Encounter (Signed)
Patient is okay with having procedure at [facility] and we reschedule patient to 01/14/2023 with Dr. Allegra Lai. Sent Debbie a secure chart at [facility] center so she can get patient moved to [facility]. Sent out new instructions to the patient

## 2022-12-18 NOTE — Telephone Encounter (Signed)
-----   Message from Roena Malady, [place] sent at 12/17/2022  5:39 PM EDT ----- Regarding: FW: pt wants to cancel  ----- Message ----- From: Theresia Bough, RN Sent: 12/17/2022   3:24 PM EDT To: Roena Malady, CMA; # Subject: pt wants to cancel                             This is a colonoscopy pt.  She was on for 5/28, but has been moved to 6/20.  When we were talking she told me that in February she saw her PCP and PCP recommended a stress/echo.  Pt did not have test done because pt feels test is unnecessary.  I told her that Anesthesia may want to defer her until after she has had the test.  Dr Juel Burrow has let me know that pt must complete her stress/echo to have procedure here.  I called the patient to let her know (Because I had told her I would be back in touch to let her know.)  Her response is that she will just cancel because she is NOT going to have the stress test.  Didn't know if you wanted to call her or try to move her to main OR.   Please let Eunice Blase know what to do with her.    Thanks   [place]

## 2022-12-18 NOTE — Telephone Encounter (Signed)
Let's schedule her at [facility], and anesthesiologist here can determine  RV

## 2023-01-01 ENCOUNTER — Encounter: Payer: PRIVATE HEALTH INSURANCE | Admitting: Podiatry

## 2023-01-06 ENCOUNTER — Ambulatory Visit
Admission: RE | Admit: 2023-01-06 | Discharge: 2023-01-06 | Disposition: A | Discharge status: Home / Self Care | Payer: PRIVATE HEALTH INSURANCE | Source: Ambulatory Visit | Attending: Family Medicine | Admitting: Family Medicine

## 2023-01-06 DIAGNOSIS — Z1231 Encounter for screening mammogram for malignant neoplasm of breast: Secondary | ICD-10-CM | POA: Insufficient documentation

## 2023-01-07 ENCOUNTER — Telehealth: Payer: Self-pay

## 2023-01-07 ENCOUNTER — Encounter: Payer: Self-pay | Admitting: Gastroenterology

## 2023-01-07 NOTE — Telephone Encounter (Signed)
Patient left a message on my voicemail stating she had some questions about upcoming colonoscopy. Return patient call and she asked when her colonoscopy was scheduled. Informed her 01/14/2023 and she said she still had to find a ride home from the colonoscopy. She states she has some questions about her instructions. I asked her if she had the printed instructions and she said yes she did. Explained the instructions and she verbalized understanding of instructions

## 2023-01-13 ENCOUNTER — Encounter: Payer: Self-pay | Admitting: Podiatry

## 2023-01-13 ENCOUNTER — Ambulatory Visit (INDEPENDENT_AMBULATORY_CARE_PROVIDER_SITE_OTHER): Payer: PRIVATE HEALTH INSURANCE | Admitting: Podiatry

## 2023-01-13 DIAGNOSIS — M722 Plantar fascial fibromatosis: Secondary | ICD-10-CM

## 2023-01-13 NOTE — Progress Notes (Signed)
She presents today for follow-up of her Planter fasciitis she states that really is doing much better she states that she never did take her Celebrex.  Objective: Vital signs are stable alert oriented x 3.  There is no erythema edema salines drainage odor has some lymphedema to the right foot.  Assessment: No reproducible pain on palpation resolving Planter fasciitis.  Plan: Encouraged her to start on the Celebrex I will follow-up with her as needed

## 2023-01-14 ENCOUNTER — Encounter: Payer: Self-pay | Admitting: Gastroenterology

## 2023-01-14 ENCOUNTER — Ambulatory Visit
Admission: RE | Admit: 2023-01-14 | Discharge: 2023-01-14 | Disposition: A | Discharge status: Home / Self Care | Payer: PRIVATE HEALTH INSURANCE | Attending: Gastroenterology | Admitting: Gastroenterology

## 2023-01-14 ENCOUNTER — Ambulatory Visit: Payer: PRIVATE HEALTH INSURANCE | Admitting: Anesthesiology

## 2023-01-14 ENCOUNTER — Encounter
Admission: RE | Disposition: A | Discharge status: Home / Self Care | Payer: Self-pay | Source: Home / Self Care | Attending: Gastroenterology

## 2023-01-14 DIAGNOSIS — K625 Hemorrhage of anus and rectum: Secondary | ICD-10-CM

## 2023-01-14 DIAGNOSIS — Z1211 Encounter for screening for malignant neoplasm of colon: Secondary | ICD-10-CM

## 2023-01-14 HISTORY — DX: Family history of other specified conditions: Z84.89

## 2023-01-14 HISTORY — PX: COLONOSCOPY WITH PROPOFOL: SHX5780

## 2023-01-14 HISTORY — DX: Prediabetes: R73.03

## 2023-01-14 SURGERY — COLONOSCOPY WITH PROPOFOL
Anesthesia: General

## 2023-01-14 MED ORDER — PROPOFOL 10 MG/ML IV BOLUS
INTRAVENOUS | Status: DC | PRN
  Administered 2023-01-14: 100 mg via INTRAVENOUS

## 2023-01-14 MED ORDER — SODIUM CHLORIDE 0.9 % IV SOLN: INTRAVENOUS | Status: DC

## 2023-01-14 MED ORDER — LIDOCAINE HCL (CARDIAC) PF 100 MG/5ML IV SOSY
PREFILLED_SYRINGE | INTRAVENOUS | Status: DC | PRN
  Administered 2023-01-14: 100 mg via INTRAVENOUS

## 2023-01-14 NOTE — Anesthesia Preprocedure Evaluation (Signed)
Anesthesia Evaluation  Patient identified by MRN, date of birth, ID band Patient awake    Reviewed: Allergy & Precautions, NPO status , Patient's Chart, lab work & pertinent test results  History of Anesthesia Complications Negative for: history of anesthetic complications  Airway Mallampati: III  TM Distance: <3 FB Neck ROM: full    Dental  (+) Chipped   Pulmonary neg shortness of breath, asthma    Pulmonary exam normal        Cardiovascular hypertension, (-) angina Normal cardiovascular exam     Neuro/Psych  Neuromuscular disease  negative psych ROS   GI/Hepatic negative GI ROS, Neg liver ROS,neg GERD  ,,  Endo/Other  negative endocrine ROS    Renal/GU negative Renal ROS  negative genitourinary   Musculoskeletal   Abdominal   Peds  Hematology negative hematology ROS (+)   Anesthesia Other Findings Past Medical History: No date: B12 deficiency No date: Borderline high blood pressure No date: Chest pain 12/09/2022: COVID-19     Comment:  Resolved No date: Family history of adverse reaction to anesthesia     Comment:  Son - hard to wake No date: Joint pain 11/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  No date: Obesity No date: Palpitations No date: Pre-diabetes No date: PVC (premature ventricular contraction) No date: Reactive airway disease No date: SOB (shortness of breath) No date: Swallowing difficulty No date: Swelling of right foot No date: Vitamin D deficiency  Past Surgical History: No date: TUBAL LIGATION  BMI    Body Mass Index: 38.84 kg/m      Reproductive/Obstetrics negative OB ROS                             Anesthesia Physical Anesthesia Plan  ASA: 2  Anesthesia Plan: General   Post-op Pain Management:    Induction: Intravenous  PONV Risk Score and Plan: Propofol infusion and TIVA  Airway Management Planned: Natural  Airway and Nasal Cannula  Additional Equipment:   Intra-op Plan:   Post-operative Plan:   Informed Consent: I have reviewed the patients History and Physical, chart, labs and discussed the procedure including the risks, benefits and alternatives for the proposed anesthesia with the patient or authorized representative who has indicated his/her understanding and acceptance.     Dental Advisory Given  Plan Discussed with: Anesthesiologist, CRNA and Surgeon  Anesthesia Plan Comments: (Patient consented for risks of anesthesia including but not limited to:  - adverse reactions to medications - risk of airway placement if required - damage to eyes, teeth, lips or other oral mucosa - nerve damage due to positioning  - sore throat or hoarseness - Damage to heart, brain, nerves, lungs, other parts of body or loss of life  Patient voiced understanding.)       Anesthesia Quick Evaluation

## 2023-01-14 NOTE — Op Note (Signed)
[facility] Patient Name: Tara Wood [facility] Procedure Date: 01/14/2023 8:22 AM MRN: [id number] Account #: [id number] Date of Birth: [birth year] Admit Type: Outpatient Age: 57 Room: [facility] ENDO ROOM 4 Gender: Female Note Status: Finalized Instrument Name: Prentice Docker [id number] Procedure:             Colonoscopy Indications:           Screening for colorectal malignant neoplasm, This is                         the patient's first colonoscopy Providers:             Toney Reil MD, MD Medicines:             General Anesthesia Complications:         No immediate complications. Estimated blood loss: None. Procedure:             Pre-Anesthesia Assessment:                        - Prior to the procedure, a History and Physical was                         performed, and patient medications and allergies were                         reviewed. The patient is competent. The risks and                         benefits of the procedure and the sedation options and                         risks were discussed with the patient. All questions                         were answered and informed consent was obtained.                         Patient identification and proposed procedure were                         verified by the physician, the nurse, the                         anesthesiologist, the anesthetist and the technician                         in the pre-procedure area in the procedure room in the                         endoscopy suite. Mental Status Examination: alert and                         oriented. Airway Examination: normal oropharyngeal                         airway and neck mobility. Respiratory Examination:                         clear to  auscultation. CV Examination: normal.                         Prophylactic Antibiotics: The patient does not require                         prophylactic antibiotics. Prior Anticoagulants: The                          patient has taken no anticoagulant or antiplatelet                         agents. ASA Grade Assessment: II - A patient with mild                         systemic disease. After reviewing the risks and                         benefits, the patient was deemed in satisfactory                         condition to undergo the procedure. The anesthesia                         plan was to use general anesthesia. Immediately prior                         to administration of medications, the patient was                         re-assessed for adequacy to receive sedatives. The                         heart rate, respiratory rate, oxygen saturations,                         blood pressure, adequacy of pulmonary ventilation, and                         response to care were monitored throughout the                         procedure. The physical status of the patient was                         re-assessed after the procedure.                        After obtaining informed consent, the colonoscope was                         passed under direct vision. Throughout the procedure,                         the patient's blood pressure, pulse, and oxygen                         saturations were monitored continuously. The  Colonoscope was introduced through the anus and                         advanced to the the cecum, identified by appendiceal                         orifice and ileocecal valve. The colonoscopy was                         performed without difficulty. The patient tolerated                         the procedure well. The quality of the bowel                         preparation was evaluated using the BBPS [facility] Bowel                         Preparation Scale) with scores of: Right Colon = 3,                         Transverse Colon = 3 and Left Colon = 3 (entire mucosa                         seen well with no residual staining, small  fragments                         of stool or opaque liquid). The total BBPS score                         equals 9. The ileocecal valve, appendiceal orifice,                         and rectum were photographed. Findings:      The perianal and digital rectal examinations were normal. Pertinent       negatives include normal sphincter tone and no palpable rectal lesions.      The entire examined colon appeared normal.      The retroflexed view of the distal rectum and anal verge was normal and       showed no anal or rectal abnormalities. Impression:            - The entire examined colon is normal.                        - The distal rectum and anal verge are normal on                         retroflexion view.                        - No specimens collected. Recommendation:        - Discharge patient to home (with escort).                        - Resume previous diet today.                        -  Continue present medications.                        - Repeat colonoscopy in 10 years for screening                         purposes. Procedure Code(s):     --- Professional ---                        N6295, Colorectal cancer screening; colonoscopy on                         individual not meeting criteria for high risk Diagnosis Code(s):     --- Professional ---                        Z12.11, Encounter for screening for malignant neoplasm                         of colon CPT copyright 2022 American Medical Association. All rights reserved. The codes documented in this report are preliminary and upon coder review may  be revised to meet current compliance requirements. Dr. Libby Maw Toney Reil MD, MD 01/14/2023 9:03:16 AM This report has been signed electronically. Number of Addenda: 0 Note Initiated On: 01/14/2023 8:22 AM Scope Withdrawal Time: 0 hours 8 minutes 25 seconds  Total Procedure Duration: 0 hours 10 minutes 48 seconds  Estimated Blood Loss:  Estimated blood loss:  none.      [facility]

## 2023-01-14 NOTE — Anesthesia Postprocedure Evaluation (Signed)
Anesthesia Post Note  Patient: Tara Wood  Procedure(s) Performed: COLONOSCOPY WITH PROPOFOL  Patient location during evaluation: Endoscopy Anesthesia Type: General Level of consciousness: awake and alert Pain management: pain level controlled Vital Signs Assessment: post-procedure vital signs reviewed and stable Respiratory status: spontaneous breathing, nonlabored ventilation, respiratory function stable and patient connected to nasal cannula oxygen Cardiovascular status: blood pressure returned to baseline and stable Postop Assessment: no apparent nausea or vomiting Anesthetic complications: no   No notable events documented.   Last Vitals:  Vitals:   01/14/23 0828 01/14/23 0906  BP: 133/87 111/73  Pulse: 87   Temp: (!) 35.7 C (!) 35.8 C  SpO2: 100%     Last Pain:  Vitals:   01/14/23 0926  TempSrc:   PainSc: 0-No pain                 Cleda Mccreedy Rida Loudin

## 2023-01-14 NOTE — Transfer of Care (Signed)
Immediate Anesthesia Transfer of Care Note  Patient: Tara Wood  Procedure(s) Performed: COLONOSCOPY WITH PROPOFOL  Patient Location: PACU  Anesthesia Type:General  Level of Consciousness: sedated  Airway & Oxygen Therapy: Patient Spontanous Breathing  Post-op Assessment: Report given to RN and Post -op Vital signs reviewed and stable  Post vital signs: Reviewed and stable  Last Vitals:  Vitals Value Taken Time  BP 111/73 01/14/23 0906  Temp 35.8 C 01/14/23 0906  Pulse 82 01/14/23 0909  Resp 18 01/14/23 0909  SpO2 97 % 01/14/23 0909  Vitals shown include unvalidated device data.  Last Pain:  Vitals:   01/14/23 0906  TempSrc: Temporal  PainSc: Asleep         Complications: No notable events documented.

## 2023-01-14 NOTE — H&P (Signed)
**Note Tara-Identified via Obfuscation** Tara Repress, MD [address]  [address]  [place], [place] [address]  Main: [phone]  Fax: [phone] Pager: [phone]  Primary Care Physician:  [facility] Primary Gastroenterologist:  Dr. Arlyss Wood  Pre-Procedure History & Physical: HPI:  Tara Wood is a 57 y.o. female is here for an colonoscopy.   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 Authorizing Provider  Cholecalciferol (VITAMIN D3) 125 MCG (5000 UT) TABS Take by mouth.   Yes [provider]  cyanocobalamin 1000 MCG tablet Take 1,000 mcg by mouth daily.   Yes [provider]  levalbuterol Pauline Aus HFA) 45 MCG/ACT inhaler Inhale into the lungs every 4 (four) hours as needed for wheezing.   Yes [provider]  loratadine (CLARITIN) 10 MG tablet Take by mouth.   Yes [provider]  spironolactone (ALDACTONE) 25 MG tablet Take 1 tablet (25 mg total) by mouth daily. 11/30/19  Yes Corinna Capra A, DO  triamcinolone ointment (KENALOG) 0.1 % Apply topically 2 (two) times daily. 06/03/21  Yes [provider]  estradiol (ESTRACE) 0.1 MG/GM vaginal cream Place 1 g vaginally daily. Patient not taking: Reported on 12/17/2022 01/25/22   [provider]  meloxicam (MOBIC) 15 MG tablet Take 1 tablet (15  mg total) by mouth daily. Patient not taking: Reported on 12/17/2022 07/01/22   Ernestene Kiel T, DPM  buPROPion [facility] SR) 150 MG 12 hr tablet Take 1 tablet (150 mg total) by mouth daily. 09/23/19 01/03/20  Corinna Capra A, DO  fexofenadine (ALLEGRA) 180 MG tablet Take 180 mg by mouth daily.  07/02/19  [provider]    Allergies as of 12/09/2022 - Review Complete 12/09/2022  Allergen Reaction Noted   Other Hives 08/01/2021   Gramineae pollens Other (See Comments) 08/01/2021    Family History  Problem Relation Age of Onset   Heart failure Mother    Parkinson's disease Mother    Heart disease Mother    Stroke Father    High blood pressure Father    Hypertension Father    Breast cancer Maternal Aunt        lat eyears   Ovarian cancer Maternal Aunt        not sure of age    Social History   Socioeconomic History   Marital status: Married    Spouse name: Tara Wood   Number of children: Not on file   Years of education: Not on file   Highest education level: Not on file  Occupational History   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  Tobacco Use   Smoking status: Never   Smokeless tobacco: Never  Vaping Use   Vaping Use: Never used  Substance and Sexual Activity   Alcohol use: Never   Drug use: Never   Sexual activity: Yes    Birth control/protection: None  Other Topics Concern   Not on file  Social History Narrative   Not on file   Social Determinants of Health   Financial Resource Strain: Not on file  Food Insecurity: Not on file  Transportation Needs: Not on file  Physical Activity: Not on file  Stress: Not on file  Social Connections: Not on file  Intimate Partner Violence: Not on file    Review of Systems: See HPI, otherwise negative ROS  Physical Exam: BP 133/87   Pulse 87   Temp (!) 96.2 F (35.7 C) (Temporal)   Ht 5\' 7"  (1.702 m)   Wt 107.2 kg   LMP 10/20/2021 (Exact Date)   SpO2 100%   BMI 37.03 kg/m  General:   Alert,   pleasant and cooperative in NAD Head:  Normocephalic and atraumatic. Neck:  Supple; no masses or thyromegaly. Lungs:  Clear throughout to auscultation.    Heart:  Regular rate and rhythm. Abdomen:  Soft, nontender and nondistended. Normal bowel sounds, without guarding, and without rebound.   Neurologic:  Alert and  oriented x4;  grossly normal neurologically.  Impression/Plan: Tara Wood is here for an colonoscopy to be performed for colon cancer screening  Risks, benefits, limitations, and alternatives regarding  colonoscopy have been reviewed with the patient.  Questions have been answered.  All parties agreeable.   Tara Donath, MD  01/14/2023, 8:34 AM

## 2023-01-15 ENCOUNTER — Encounter: Payer: Self-pay | Admitting: Gastroenterology

## 2023-01-16 ENCOUNTER — Other Ambulatory Visit: Payer: Self-pay | Admitting: Unknown Physician Specialty

## 2023-01-16 DIAGNOSIS — R430 Anosmia: Secondary | ICD-10-CM

## 2023-02-04 ENCOUNTER — Ambulatory Visit (INDEPENDENT_AMBULATORY_CARE_PROVIDER_SITE_OTHER): Payer: PRIVATE HEALTH INSURANCE | Admitting: Podiatry

## 2023-02-04 DIAGNOSIS — W57XXXA Bitten or stung by nonvenomous insect and other nonvenomous arthropods, initial encounter: Secondary | ICD-10-CM | POA: Diagnosis not present

## 2023-02-04 DIAGNOSIS — M79673 Pain in unspecified foot: Secondary | ICD-10-CM | POA: Diagnosis not present

## 2023-02-04 NOTE — Progress Notes (Signed)
Subjective:  Patient ID: Tara Wood, female    DOB: 31-Jan-1966,  MRN: [id number]  Chief Complaint  Patient presents with   Foot Pain    Pt stated that she thinks something bit her and it caused her foot to swell and have some pain and discomfort     57 y.o. female presents with the above complaint.  Patient presents with right ankle superficial bug bite that led to some swelling it caused her foot to have a lot of pain and discomfort but is she states that it is improving now.  She does want to get it evaluated make sure there is nothing else going on.  Denies seeing anyone else prior to seeing me.  She is known to Dr. Al Corpus and has been being treated by for Planter fasciitis which is doing better.  She denies any other acute issues.  She denies taking any new results.   Review of Systems: Negative except as noted in the HPI. Denies N/V/F/Ch.  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    Obesity    Palpitations    Pre-diabetes    PVC (premature ventricular contraction)    Reactive airway disease    SOB (shortness of breath)    Swallowing difficulty    Swelling of right foot    Vitamin D deficiency     Current Outpatient Medications:    Na Sulfate-K Sulfate-Mg Sulf 17.5-3.13-1.6 GM/177ML SOLN, SMARTSIG:[id number] Milliliter(s) By Mouth Once, Disp: , Rfl:    Cholecalciferol (VITAMIN D3) 125 MCG (5000 UT) TABS, Take by mouth., Disp: , Rfl:    cyanocobalamin 1000 MCG tablet, Take 1,000 mcg by mouth daily., Disp: , Rfl:    levalbuterol (XOPENEX HFA) 45 MCG/ACT inhaler, Inhale into the lungs every 4 (four) hours as needed for wheezing., Disp: , Rfl:    loratadine (CLARITIN) 10 MG tablet, Take by mouth., Disp: , Rfl:     spironolactone (ALDACTONE) 25 MG tablet, Take 1 tablet (25 mg total) by mouth daily., Disp: 30 tablet, Rfl: 0   triamcinolone ointment (KENALOG) 0.1 %, Apply topically 2 (two) times daily., Disp: , Rfl:   Social History   Tobacco Use  Smoking Status Never  Smokeless Tobacco Never    Allergies  Allergen Reactions   Other Hives   Gramineae Pollens Other (See Comments)    Needs inhaler   Tape Itching and Rash    When left on for extended time   Objective:  There were no vitals filed for this visit. There is no height or weight on file to calculate BMI. Constitutional Well developed. Well nourished.  Vascular Dorsalis pedis pulses palpable bilaterally. Posterior tibial pulses palpable bilaterally. Capillary refill normal to all digits.  No cyanosis or clubbing noted. Pedal hair growth normal.  Neurologic Normal speech. Oriented to person, place, and time. Epicritic sensation to light touch grossly present bilaterally.  Dermatologic Right ankle superficial bug bite without any erythema or signs of infection noted.  No purulent drainage noted no pocket of abscess noted.  Orthopedic: Normal joint ROM without pain or crepitus bilaterally. No visible deformities. No bony tenderness.   Radiographs: None Assessment:   1. Bug  bite of face without infection, initial encounter    Plan:  Patient was evaluated and treated and all questions answered.  Right ankle bug bite resolving -All questions and concerns were discussed with the patient in extensive detail.  I discussed with her sometimes it can get swelling and pain with the bug bite especially there is an allergic reaction associated with it.  Clinically it is improving considerably since the first originally happened few weeks ago.  I discussed with her continuing to monitor if it regresses come back to see me.  She states understanding  No follow-ups on file.

## 2023-04-16 IMAGING — US US THYROID
1 series · 15 of 25 positions shown · non-contrast
Comparison: None.

CLINICAL DATA: Enlarged thyroid

EXAM:
THYROID ULTRASOUND
TECHNIQUE: Ultrasound examination of the thyroid gland and adjacent soft
tissues was performed.

[Series 1: us thyroid mc & wl · 15 of 41 slices shown]
[im 1/41]
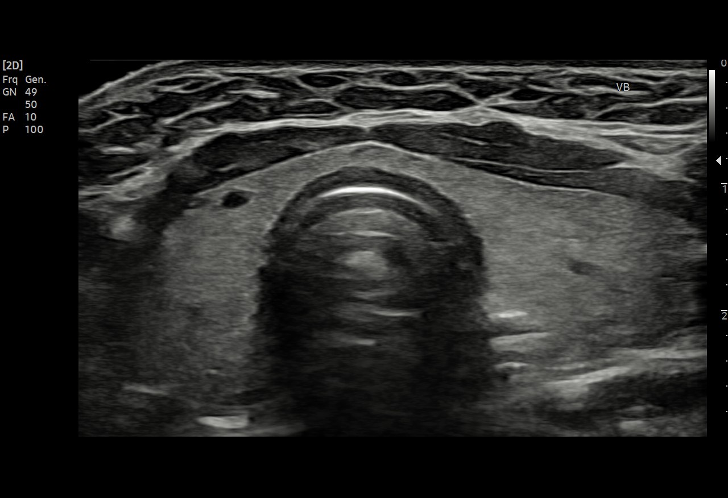
[im 4/41]
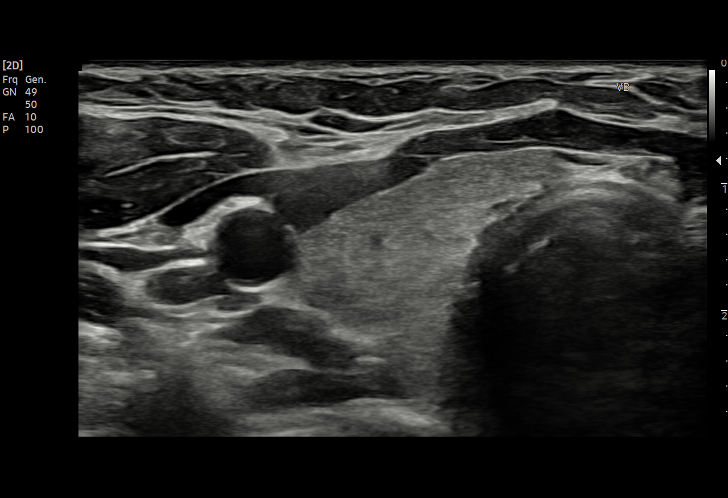
[im 7/41]
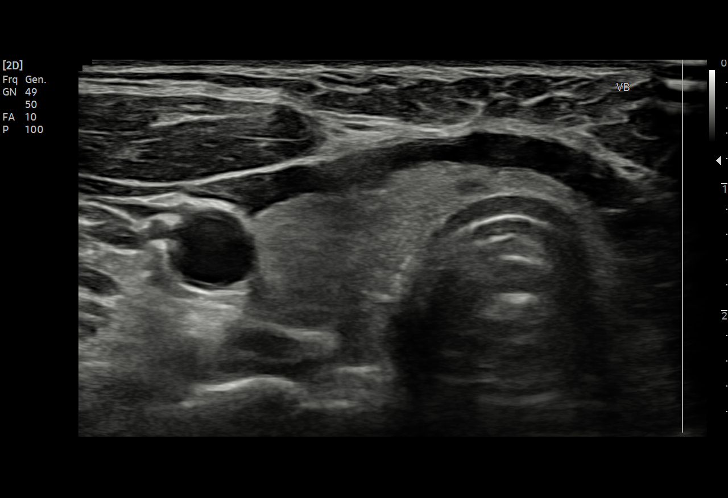
[im 9/41]
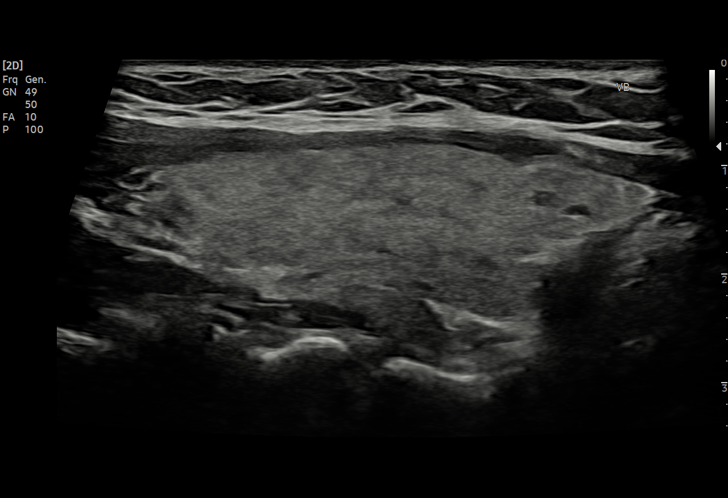
[im 12/41]
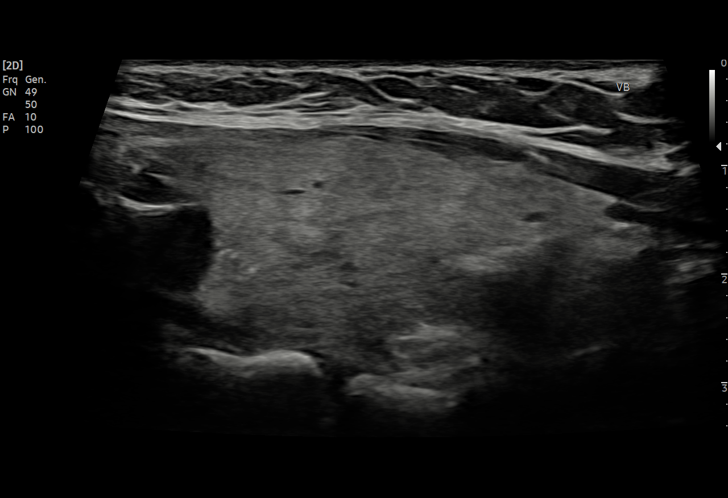
[im 16/41]
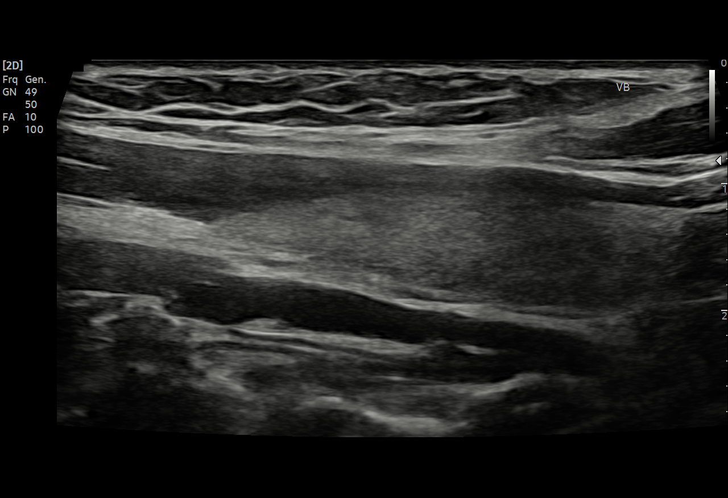
[im 17/41]
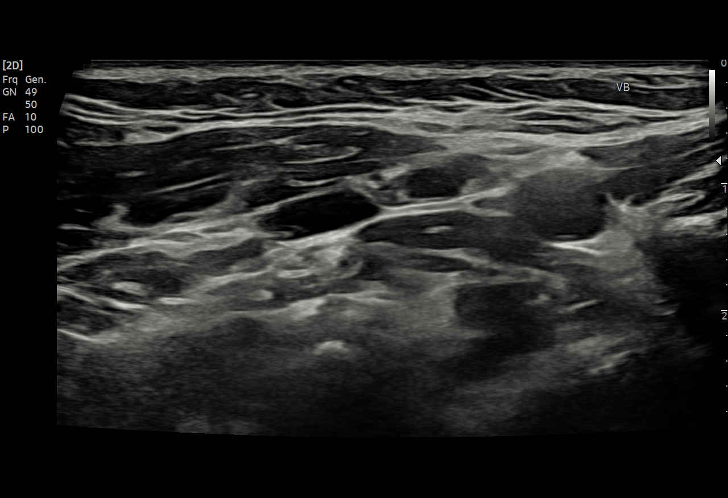
[im 21/41]
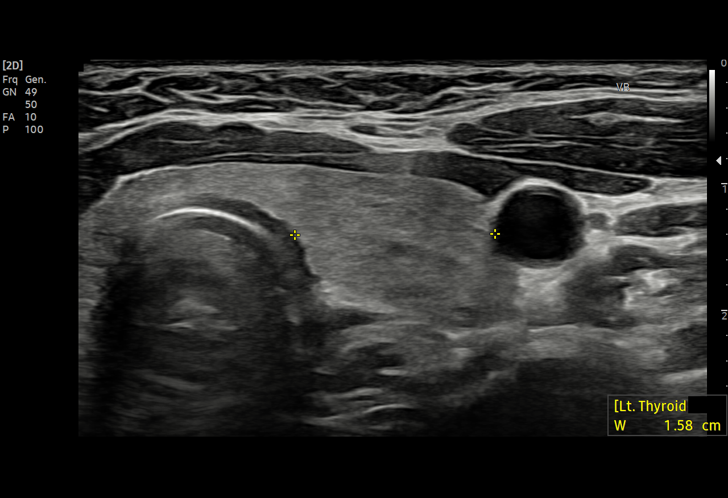
[im 24/41]
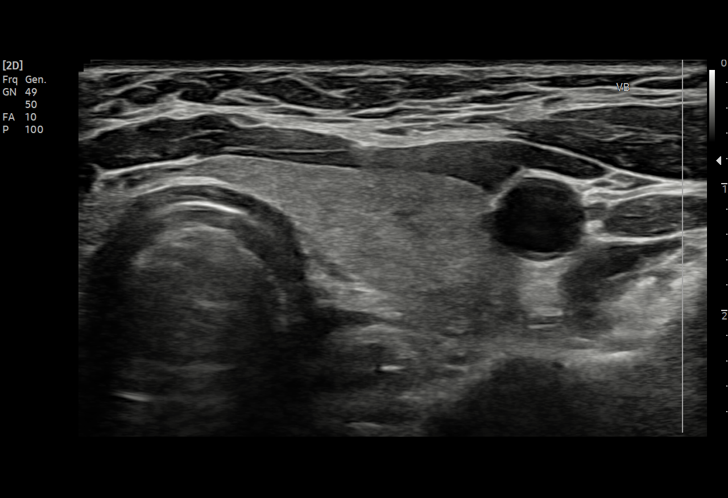
[im 26/41]
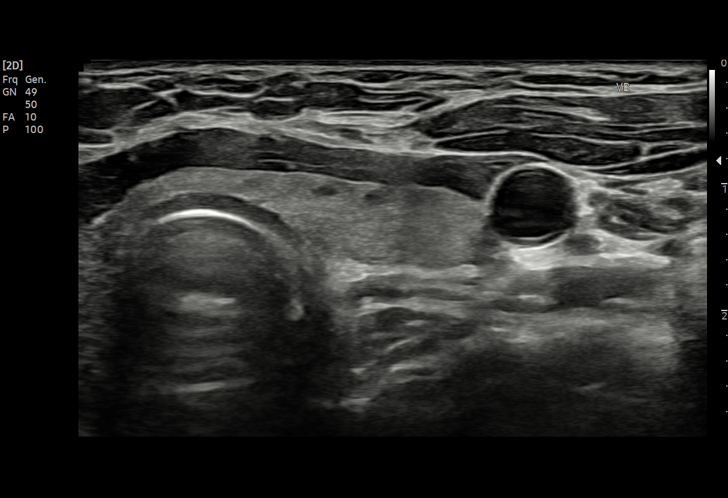
[im 29/41]
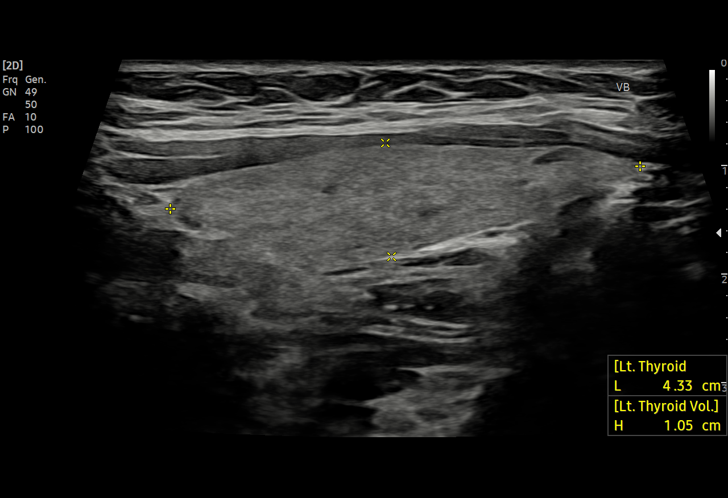
[im 32/41]
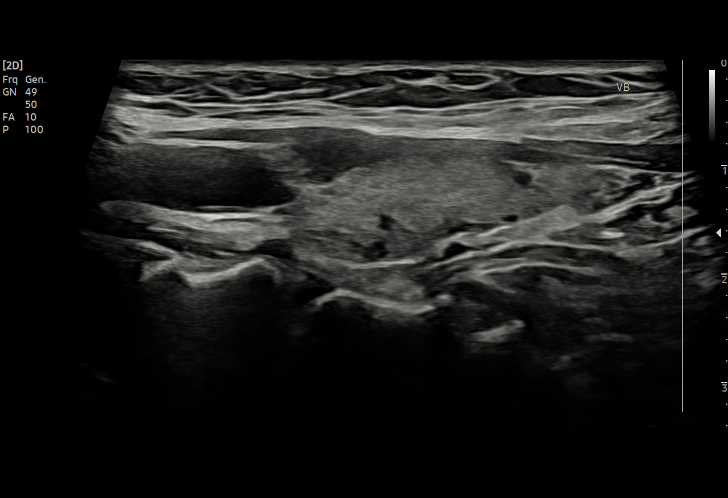
[im 34/41]
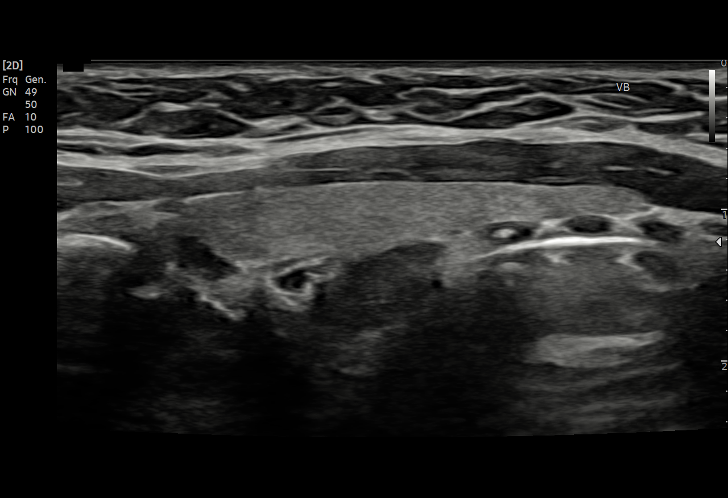
[im 37/41]
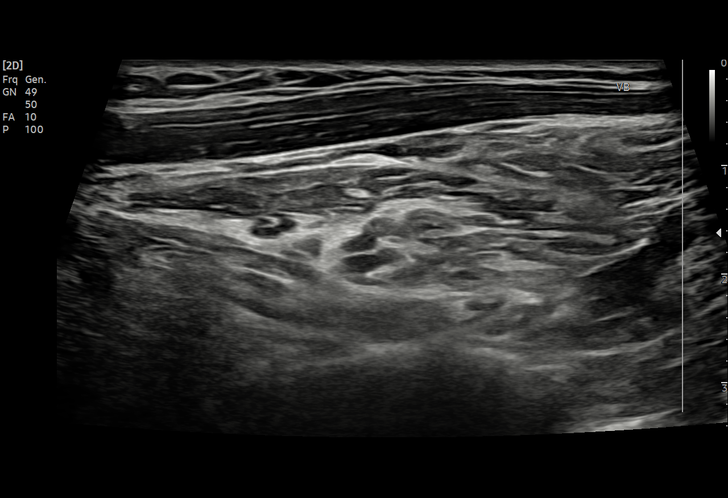
[im 41/41]
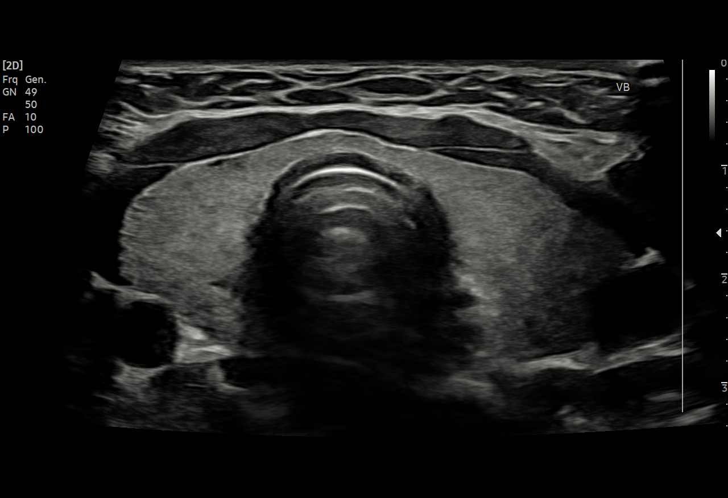

[15 of 25 positions shown; findings below may reference images not displayed]

FINDINGS: Parenchymal Echotexture: Mildly heterogenous

Isthmus: 0.2 cm

Right lobe: 4.8 x 1.7 x 1.7 cm

Left lobe: 5.0 x 1.1 x 1.6 cm

_________________________________________________________

Estimated total number of nodules >/= 1 cm: 0

Number of spongiform nodules >/=  2 cm not described below (TR1): 0

Number of mixed cystic and solid nodules >/= 1.5 cm not described
below (TR2): 0

_________________________________________________________

No discrete nodules are seen within the thyroid gland.
IMPRESSION: Mild diffuse heterogeneity of the thyroid parenchyma without
discrete nodule.

The above is in keeping with the ACR TI-RADS recommendations - [HOSPITAL] 3380;[DATE].

## 2023-05-12 ENCOUNTER — Other Ambulatory Visit: Payer: Self-pay | Admitting: Family Medicine

## 2023-05-12 DIAGNOSIS — R1011 Right upper quadrant pain: Secondary | ICD-10-CM

## 2023-05-23 ENCOUNTER — Ambulatory Visit: Payer: PRIVATE HEALTH INSURANCE

## 2023-05-23 ENCOUNTER — Encounter: Payer: Self-pay | Admitting: Interventional Radiology

## 2023-09-09 NOTE — Progress Notes (Deleted)
 PCP: [facility]   No chief complaint on file.   HPI:      Ms. Tara Wood is a 58 y.o. No obstetric history on file. whose LMP was Patient's last menstrual period was 10/20/2021 (exact date)., presents today for her annual examination.  Her menses are absent due to menopause (LMP 10/20 or 10/21 per pt), except 1 episode PMB 3/23 with neg EMB.    Sex activity: single partner. She does not have vaginal dryness but has a tender spot LT vaginal opening with trace amt bleeding with wiping. Rarely uses lubricants. Has decreased sensation with sex and no pleasure. Just feels like pressure. Tried pelvic PT a few yrs ago without much improvement.  Has noticed odor after sex past few months. Hx of BV in past but not treated per pt. Denies sx otherwise.   Last Pap: 08/17/20 Results were NILM/neg HPV DNA; no hx of abn paps.  Last mammogram: 01/06/23  Results were: normal--routine follow-up in 12 months There is a FH of breast cancer and ovarian cancer in her mat aunt. The patient does not do self-breast exams. Pt is MyRisk neg 2015 except heterozygous MUTYH mutation, conflicting studies with colon cancer risk per NCCN 2025 guidelines, no increased screening since no FH for pt.   Colonoscopy: 6/24 at [facility]; repeat due after 10 yrs???  Tobacco use: The patient denies current or previous tobacco use. Alcohol use: none  No drug use Exercise: not active  She does not get adequate calcium but does get Vitamin D in her diet.  Hx of frequent UTIs. Has urinary urgency and frequency. Occas has caffeine. Hx of hematuria on UA, saw urology in distant past.  Labs with PCP.   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     Obesity    Palpitations    Pre-diabetes    PVC (premature ventricular contraction)    Reactive airway disease    SOB (shortness of breath)    Swallowing difficulty    Swelling of right foot    Vitamin D deficiency     Past Surgical History:  Procedure Laterality Date   COLONOSCOPY WITH PROPOFOL N/A 01/14/2023   Procedure: COLONOSCOPY WITH PROPOFOL;  Surgeon: Toney Reil, MD;  Location: [facility] ENDOSCOPY;  Service: Endoscopy;  Laterality: N/A;   LAPAROSCOPY     TUBAL LIGATION      Family History  Problem Relation Age of Onset   Heart failure Mother    Parkinson's disease Mother    Heart disease Mother    Stroke Father    High blood pressure Father    Hypertension Father    Breast cancer Maternal Aunt        lat eyears   Ovarian cancer Maternal Aunt        not sure of age    Social History   Socioeconomic History   Marital status: Married    Spouse name: De Nurse   Number of children: Not on file   Years of education: Not on file   Highest education level: Not on file  Occupational History   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  Tobacco  Use   Smoking status: Never   Smokeless tobacco: Never  Vaping Use   Vaping status: Never Used  Substance and Sexual Activity   Alcohol use: Never   Drug use: Never   Sexual activity: Yes    Birth control/protection: None  Other Topics Concern   Not on file  Social History Narrative   Not on file   Social Drivers of Health   Financial Resource Strain: Low Risk  (10/23/2022)   Received from [facility], [facility]   Overall Financial Resource Strain (CARDIA)    Difficulty of Paying Living Expenses: Not hard at all  Food Insecurity: No Food Insecurity (10/23/2022)   Received from [facility], [facility]   Hunger Vital Sign    Worried About Running Out of Food in the Last Year: Never true    Ran Out of Food in the Last Year: Never  true  Transportation Needs: No Transportation Needs (10/23/2022)   Received from [facility], [facility]   [facility] - Transportation    In the past 12 months, has lack of transportation kept you from medical appointments or from getting medications?: No    Lack of Transportation (Non-Medical): No  Physical Activity: Not on file  Stress: Not on file  Social Connections: Not on file  Intimate Partner Violence: Not on file     Current Outpatient Medications:    Cholecalciferol (VITAMIN D3) 125 MCG (5000 UT) TABS, Take by mouth., Disp: , Rfl:    cyanocobalamin 1000 MCG tablet, Take 1,000 mcg by mouth daily., Disp: , Rfl:    levalbuterol (XOPENEX HFA) 45 MCG/ACT inhaler, Inhale into the lungs every 4 (four) hours as needed for wheezing., Disp: , Rfl:    loratadine (CLARITIN) 10 MG tablet, Take by mouth., Disp: , Rfl:    Na Sulfate-K Sulfate-Mg Sulf 17.5-3.13-1.6 GM/177ML SOLN, SMARTSIG:354 Milliliter(s) By Mouth Once, Disp: , Rfl:    spironolactone (ALDACTONE) 25 MG tablet, Take 1 tablet (25 mg total) by mouth daily., Disp: 30 tablet, Rfl: 0   triamcinolone ointment (KENALOG) 0.1 %, Apply topically 2 (two) times daily., Disp: , Rfl:      ROS:  Review of Systems  Constitutional:  Negative for fatigue, fever and unexpected weight change.  Respiratory:  Negative for cough, shortness of breath and wheezing.   Cardiovascular:  Negative for chest pain, palpitations and leg swelling.  Gastrointestinal:  Negative for blood in stool, constipation, diarrhea, nausea and vomiting.  Endocrine: Negative for cold intolerance, heat intolerance and polyuria.  Genitourinary:  Positive for dyspareunia, frequency and urgency. Negative for dysuria, flank pain, genital sores, hematuria, menstrual problem, pelvic pain, vaginal bleeding, vaginal discharge and vaginal pain.  Musculoskeletal:  Negative for back pain, joint swelling and myalgias.  Skin:  Negative for rash.   Neurological:  Negative for dizziness, syncope, light-headedness, numbness and headaches.  Hematological:  Negative for adenopathy.  Psychiatric/Behavioral:  Negative for agitation, confusion, sleep disturbance and suicidal ideas. The patient is not nervous/anxious.   BREAST: No symptoms    Objective: LMP 10/20/2021 (Exact Date)    Physical Exam Constitutional:      Appearance: She is well-developed.  Genitourinary:     Vulva normal.     Right Labia: No rash, tenderness or lesions.    Left Labia: No tenderness, lesions or rash.    No vaginal discharge, erythema or tenderness.      Right Adnexa: not tender and no mass  present.    Left Adnexa: not tender and no mass present.    No cervical friability or polyp.     Uterus is not enlarged or tender.  Breasts:    Right: No mass, nipple discharge, skin change or tenderness.     Left: No mass, nipple discharge, skin change or tenderness.  Neck:     Thyroid: No thyromegaly.  Cardiovascular:     Rate and Rhythm: Normal rate and regular rhythm.     Heart sounds: Normal heart sounds. No murmur heard. Pulmonary:     Effort: Pulmonary effort is normal.     Breath sounds: Normal breath sounds.  Abdominal:     Palpations: Abdomen is soft.     Tenderness: There is no abdominal tenderness. There is no guarding or rebound.  Musculoskeletal:        General: Normal range of motion.     Cervical back: Normal range of motion.  Lymphadenopathy:     Cervical: No cervical adenopathy.  Neurological:     General: No focal deficit present.     Mental Status: She is alert and oriented to person, place, and time.     Cranial Nerves: No cranial nerve deficit.  Skin:    General: Skin is warm and dry.  Psychiatric:        Mood and Affect: Mood normal.        Behavior: Behavior normal.        Thought Content: Thought content normal.        Judgment: Judgment normal.  Vitals reviewed.     Results: No results found for this or any previous  visit (from the past 24 hours).   Assessment/Plan:  Encounter for annual routine gynecological examination  Cervical cancer screening - Plan: Cytology - PAP  Screening for HPV (human papillomavirus) - Plan: Cytology - PAP  Encounter for screening mammogram for malignant neoplasm of breast - Plan: MM 3D SCREEN BREAST BILATERAL; pt to sched mammo  Screening for colon cancer - Plan: Ambulatory referral to Gastroenterology; refer to GI  Monoallelic mutation of MUTYH gene - Plan: Ambulatory referral to Gastroenterology  Bacterial vaginosis - Plan: POCT Wet Prep with KOH, metroNIDAZOLE (FLAGYL) 500 MG tablet, DISCONTINUED: metroNIDAZOLE (FLAGYL) 500 MG tablet; pos sx after sex and pos wet prep. Rx flagyl, no EtOH. F/u prn.   Urinary urgency - Plan: POCT Urinalysis Dipstick, Urinalysis, Routine w reflex microscopic, Urine Culture; neg UA except hematuria. Check C&S and UA.   Asymptomatic microscopic hematuria - Plan: Urinalysis, Routine w reflex microscopic; check LC UA. If pos, will refer back to urology.   No orders of the defined types were placed in this encounter.           GYN counsel breast self exam, mammography screening, menopause, adequate intake of calcium and vitamin D, diet and exercise    F/U  No follow-ups on file.  Dacy Enrico B. Peyson Delao, PA-C 09/09/2023 12:14 PM

## 2023-09-11 ENCOUNTER — Ambulatory Visit: Payer: Self-pay | Admitting: Obstetrics and Gynecology

## 2023-09-11 DIAGNOSIS — Z1231 Encounter for screening mammogram for malignant neoplasm of breast: Secondary | ICD-10-CM

## 2023-09-11 DIAGNOSIS — Z1211 Encounter for screening for malignant neoplasm of colon: Secondary | ICD-10-CM

## 2023-09-11 DIAGNOSIS — N950 Postmenopausal bleeding: Secondary | ICD-10-CM

## 2023-09-11 DIAGNOSIS — Z01419 Encounter for gynecological examination (general) (routine) without abnormal findings: Secondary | ICD-10-CM

## 2023-09-26 ENCOUNTER — Ambulatory Visit
Admission: RE | Admit: 2023-09-26 | Discharge: 2023-09-26 | Disposition: A | Discharge status: Home / Self Care | Payer: PRIVATE HEALTH INSURANCE | Source: Ambulatory Visit | Attending: Family Medicine | Admitting: Family Medicine

## 2023-09-26 DIAGNOSIS — R1011 Right upper quadrant pain: Secondary | ICD-10-CM | POA: Diagnosis present

## 2023-10-05 NOTE — Progress Notes (Addendum)
 PCP: [facility]   Chief Complaint  Patient presents with   Gynecologic Exam    Feels heavyness in vaginal area at times, sensitive skin in pelvic area that will get itchy/rash    HPI:      Ms. Tara Wood is a 58 y.o. No obstetric history on file. whose LMP was Patient's last menstrual period was 10/20/2021 (exact date)., presents today for her annual examination.  Her menses are absent due to menopause (LMP 10/20 or 10/21 per pt), except 1 episode PMB 3/23 with neg EMB. No VS sx.   Sex activity: single partner. She does have some initial vaginal dryness with small amount red blood afterwards with wiping once. No bleeding during sex. Rarely uses lubricants but sx improved in past when she did. Notices either vaginal odor or UTI sx after sex. Has altered sense of smell after covid so pt feels like she has bad vaginal odor after sex, but no one has said anything to her. Doesn't notice increase vag d/c with odor. No meds to treat. Notices a place inside RT vaginal area that itches after sex. No external areas of itch. Uses Dove sens skin soap, no dryer sheets, cotton underwear.  Was given vag ERT in past for UTI prevention and pt noticed improvement with sex, but feels cleaning applicator is very messy.   Pt has noticed pelvic heaviness recently, over past few months. Sx last a few days, then resolve. Has sit down job and thinks related. Feels like round ligament discomfort (like when pregnant) too. No GI, urin sx with heaviness. Did pelvic PT in very distant past. Hx of asymptomatic hematuria and recurrent UTIs, has seen urology in past. No longer doing vag ERT.   Last Pap: 08/17/20 Results were NILM/neg HPV DNA; no hx of abn paps.  Last mammogram: 01/06/23  Results were: normal--routine follow-up in 12 months There is a FH of breast cancer and ovarian cancer in her mat aunt. The patient does not do self-breast exams. Pt is MyRisk neg 2015 except heterozygous MUTYH  mutation, conflicting studies with colon cancer risk per NCCN 2025 guidelines, no increased screening since no FH for pt.   Colonoscopy: 6/24 at [facility]; repeat due after 10 yrs.  Tobacco use: The patient denies current or previous tobacco use. Alcohol use: none  No drug use Exercise: not active  She does not get adequate calcium and Vitamin D in her diet.  Labs with PCP.   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    Obesity    Palpitations    Pre-diabetes    PVC (premature ventricular contraction)    Reactive airway disease    SOB (shortness of breath)    Swallowing difficulty    Swelling of right foot    Vitamin D deficiency     Past Surgical History:  Procedure Laterality Date   COLONOSCOPY WITH PROPOFOL N/A 01/14/2023   Procedure: COLONOSCOPY WITH PROPOFOL;  Surgeon: Toney Reil, MD;  Location: [facility] ENDOSCOPY;  Service: Endoscopy;  Laterality: N/A;   LAPAROSCOPY     TUBAL LIGATION      Family History  Problem Relation  Age of Onset   Heart failure Mother    Parkinson's disease Mother    Heart disease Mother    Stroke Father    High blood pressure Father    Hypertension Father    Breast cancer Maternal Aunt        late years   Ovarian cancer Maternal Aunt        not sure of age    Social History   Socioeconomic History   Marital status: Married    Spouse name: De Nurse   Number of children: Not on file   Years of education: Not on file   Highest education level: Not on file  Occupational History   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  Tobacco Use   Smoking status: Never   Smokeless tobacco: Never  Vaping Use   Vaping status: Never Used  Substance and Sexual Activity   Alcohol  use: Never   Drug use: Never   Sexual activity: Yes    Birth control/protection: None  Other Topics Concern   Not on file  Social History Narrative   Not on file   Social Drivers of Health   Financial Resource Strain: Low Risk  (10/23/2022)   Received from [facility], [facility]   Overall Financial Resource Strain (CARDIA)    Difficulty of Paying Living Expenses: Not hard at all  Food Insecurity: No Food Insecurity (10/23/2022)   Received from [facility], [facility]   Hunger Vital Sign    Worried About Running Out of Food in the Last Year: Never true    Ran Out of Food in the Last Year: Never true  Transportation Needs: No Transportation Needs (10/23/2022)   Received from [facility], [facility]   PRAPARE - Transportation    In the past 12 months, has lack of transportation kept you from medical appointments or from getting medications?: No    Lack of Transportation (Non-Medical): No  Physical Activity: Not on file  Stress: Not on file  Social Connections: Not on file  Intimate Partner Violence: Not on file     Current Outpatient Medications:    Cholecalciferol (VITAMIN D3) 125 MCG (5000 UT) TABS, Take by mouth., Disp: , Rfl:    cyanocobalamin 1000 MCG tablet, Take 1,000 mcg by mouth daily., Disp: , Rfl:    estradiol (ESTRACE VAGINAL) 0.1 MG/GM vaginal cream, Apply pea sized amount externally and at introitus twice weekly as maintenance, Disp: 42.5 g, Rfl: 0   Estradiol (IMVEXXY MAINTENANCE PACK) 10 MCG INST, Insert 1 supp vaginally twice wkly, Disp: 24 each, Rfl: 3   levalbuterol (XOPENEX HFA) 45 MCG/ACT inhaler, Inhale into the lungs every 4 (four) hours as needed for wheezing., Disp: , Rfl:    loratadine (CLARITIN) 10 MG tablet, Take by mouth., Disp: , Rfl:    naproxen (NAPROSYN) 500 MG tablet, Take 500 mg by mouth 2 (two) times daily., Disp: , Rfl:     spironolactone (ALDACTONE) 25 MG tablet, Take 1 tablet (25 mg total) by mouth daily., Disp: 30 tablet, Rfl: 0   triamcinolone cream (KENALOG) 0.1 %, Apply topically 2 (two) times daily., Disp: , Rfl:    triamcinolone ointment (KENALOG) 0.1 %, Apply topically 2 (two) times daily., Disp: , Rfl:      ROS:  Review of Systems  Constitutional:  Negative for fatigue, fever and unexpected weight change.  Respiratory:  Negative for cough, shortness of breath and wheezing.  Cardiovascular:  Negative for chest pain, palpitations and leg swelling.  Gastrointestinal:  Negative for blood in stool, constipation, diarrhea, nausea and vomiting.  Endocrine: Negative for cold intolerance, heat intolerance and polyuria.  Genitourinary:  Positive for dyspareunia. Negative for dysuria, flank pain, frequency, genital sores, hematuria, menstrual problem, pelvic pain, urgency, vaginal bleeding, vaginal discharge and vaginal pain.  Musculoskeletal:  Positive for arthralgias. Negative for back pain, joint swelling and myalgias.  Skin:  Negative for rash.  Neurological:  Positive for headaches. Negative for dizziness, syncope, light-headedness and numbness.  Hematological:  Negative for adenopathy.  Psychiatric/Behavioral:  Negative for agitation, confusion, sleep disturbance and suicidal ideas. The patient is not nervous/anxious.   BREAST: No symptoms    Objective: BP 112/73   Pulse 85   Ht 5\' 7"  (1.702 m)   Wt 243 lb (110.2 kg)   LMP 10/20/2021 (Exact Date)   BMI 38.06 kg/m    Physical Exam Constitutional:      Appearance: She is well-developed.  Genitourinary:     Vulva normal.     Genitourinary Comments: NO CYSTO/RECTOCELE/NO PELVIC PROLAPSE     Right Labia: No rash, tenderness or lesions.    Left Labia: No tenderness, lesions or rash.       No vaginal discharge, erythema or tenderness.     No vaginal prolapse present.    Moderate vaginal atrophy present.     Right Adnexa: not tender and  no mass present.    Left Adnexa: not tender and no mass present.    No cervical friability or polyp.     Uterus is not enlarged or tender.  Breasts:    Right: No mass, nipple discharge, skin change or tenderness.     Left: No mass, nipple discharge, skin change or tenderness.  Neck:     Thyroid: No thyromegaly.  Cardiovascular:     Rate and Rhythm: Normal rate and regular rhythm.     Heart sounds: Normal heart sounds. No murmur heard. Pulmonary:     Effort: Pulmonary effort is normal.     Breath sounds: Normal breath sounds.  Abdominal:     Palpations: Abdomen is soft.     Tenderness: There is no abdominal tenderness. There is no guarding or rebound.  Musculoskeletal:        General: Normal range of motion.     Cervical back: Normal range of motion.  Lymphadenopathy:     Cervical: No cervical adenopathy.  Neurological:     General: No focal deficit present.     Mental Status: She is alert and oriented to person, place, and time.     Cranial Nerves: No cranial nerve deficit.  Skin:    General: Skin is warm and dry.  Psychiatric:        Mood and Affect: Mood normal.        Behavior: Behavior normal.        Thought Content: Thought content normal.        Judgment: Judgment normal.  Vitals reviewed.     Results: Results for orders placed or performed in visit on 10/06/23 (from the past 24 hours)  POCT Urinalysis Dipstick     Status: Abnormal   Collection Time: 10/06/23  3:24 PM  Result Value Ref Range   Color, UA yellow    Clarity, UA clear    Glucose, UA Negative Negative   Bilirubin, UA neg    Ketones, UA neg    Spec Grav, UA 1.025 1.010 - 1.025  Blood, UA large    pH, UA 5.0 5.0 - 8.0   Protein, UA Negative Negative   Urobilinogen, UA     Nitrite, UA neg    Leukocytes, UA Negative Negative   Appearance     Odor     HX OF ASX HEMATURIA  Assessment/Plan:  Encounter for annual routine gynecological examination  Cervical cancer screening - Plan: Cytology -  PAP  Screening for HPV (human papillomavirus) - Plan: Cytology - PAP  Encounter for screening mammogram for malignant neoplasm of breast - Plan: MM 3D SCREENING MAMMOGRAM BILATERAL BREAST; pt to schedule mammo  Monoallelic mutation of MUTYH gene--no increased screening options at this time. Will cont to follow mutation NCCN guidelines  Vulvar atrophy - Plan: estradiol (ESTRACE VAGINAL) 0.1 MG/GM vaginal cream, Estradiol (IMVEXXY MAINTENANCE PACK) 10 MCG INST; pos exam. Try imvexxy inside vaginally and estradiol ERT crm externally and at urethra. Rx eRxd. Blot vaginal tissue (be gentle). F/u prn.   Postcoital bleeding--question vaginal tear. Add vag ERT and coconut oil as lubricant. F/u if sx persist. RTO with odor to confirm BV. Can try replens PC as well.   Pelvic fullness - Plan: US PELVIS TRANSVAGINAL NON-OB (TV ONLY), POCT Urinalysis Dipstick; neg exam. Check GYN u/s, if WNL, will refer back to pelvic PT. Add vag ERT.    Meds ordered this encounter  Medications   estradiol (ESTRACE VAGINAL) 0.1 MG/GM vaginal cream    Sig: Apply pea sized amount externally and at introitus twice weekly as maintenance    Dispense:  42.5 g    Refill:  0    Supervising Provider:   Hildred Laser [[id number]]   Estradiol (IMVEXXY MAINTENANCE PACK) 10 MCG INST    Sig: Insert 1 supp vaginally twice wkly    Dispense:  24 each    Refill:  3    Supervising Provider:   Waymon Budge            GYN counsel breast self exam, mammography screening, menopause, adequate intake of calcium and vitamin D, diet and exercise    F/U  Return in about 1 week (around 10/13/2023) for GYn u/s for pelvic pressure--[facility] to call wiht results.Helmut Muster B. Kamaree Wheatley, PA-C 10/06/2023 3:25 PM

## 2023-10-06 ENCOUNTER — Other Ambulatory Visit (HOSPITAL_COMMUNITY)
Admission: RE | Admit: 2023-10-06 | Discharge: 2023-10-06 | Disposition: A | Discharge status: Home / Self Care | Payer: PRIVATE HEALTH INSURANCE | Source: Ambulatory Visit | Attending: Obstetrics and Gynecology | Admitting: Obstetrics and Gynecology

## 2023-10-06 ENCOUNTER — Encounter: Payer: Self-pay | Admitting: Obstetrics and Gynecology

## 2023-10-06 ENCOUNTER — Ambulatory Visit (INDEPENDENT_AMBULATORY_CARE_PROVIDER_SITE_OTHER): Payer: Self-pay | Admitting: Obstetrics and Gynecology

## 2023-10-06 VITALS — BP 112/73 | HR 85 | Ht 67.0 in | Wt 243.0 lb

## 2023-10-06 DIAGNOSIS — Z1151 Encounter for screening for human papillomavirus (HPV): Secondary | ICD-10-CM | POA: Diagnosis present

## 2023-10-06 DIAGNOSIS — R399 Unspecified symptoms and signs involving the genitourinary system: Secondary | ICD-10-CM

## 2023-10-06 DIAGNOSIS — Z1589 Genetic susceptibility to other disease: Secondary | ICD-10-CM

## 2023-10-06 DIAGNOSIS — Z01419 Encounter for gynecological examination (general) (routine) without abnormal findings: Secondary | ICD-10-CM

## 2023-10-06 DIAGNOSIS — Z124 Encounter for screening for malignant neoplasm of cervix: Secondary | ICD-10-CM | POA: Insufficient documentation

## 2023-10-06 DIAGNOSIS — R1900 Intra-abdominal and pelvic swelling, mass and lump, unspecified site: Secondary | ICD-10-CM

## 2023-10-06 DIAGNOSIS — Z1231 Encounter for screening mammogram for malignant neoplasm of breast: Secondary | ICD-10-CM

## 2023-10-06 DIAGNOSIS — Z1211 Encounter for screening for malignant neoplasm of colon: Secondary | ICD-10-CM

## 2023-10-06 DIAGNOSIS — N905 Atrophy of vulva: Secondary | ICD-10-CM

## 2023-10-06 LAB — POCT URINALYSIS DIPSTICK
Bilirubin, UA: NEGATIVE
Glucose, UA: NEGATIVE
Ketones, UA: NEGATIVE
Leukocytes, UA: NEGATIVE
Nitrite, UA: NEGATIVE
Protein, UA: NEGATIVE
Spec Grav, UA: 1.025 (ref 1.010–1.025)
pH, UA: 5 (ref 5.0–8.0)

## 2023-10-06 MED ORDER — IMVEXXY MAINTENANCE PACK 10 MCG VA INST
VAGINAL_INSERT | VAGINAL | 3 refills | Status: AC
Start: 2023-10-06 — End: ?

## 2023-10-06 MED ORDER — ESTRADIOL 0.1 MG/GM VA CREA
TOPICAL_CREAM | VAGINAL | 0 refills | Status: AC
Start: 2023-10-06 — End: ?

## 2023-10-06 NOTE — Patient Instructions (Signed)
 I value your feedback and you entrusting [name] with your care. If you get a [facility] patient survey, I would appreciate you taking the time to let us know about your experience today. Thank you!  [facility] ([place])--[phone]

## 2023-10-08 LAB — CYTOLOGY - PAP
Comment: NEGATIVE
Diagnosis: NEGATIVE
High risk HPV: NEGATIVE

## 2023-10-09 ENCOUNTER — Other Ambulatory Visit: Payer: Self-pay | Admitting: Family Medicine

## 2023-10-09 DIAGNOSIS — K829 Disease of gallbladder, unspecified: Secondary | ICD-10-CM

## 2023-10-29 ENCOUNTER — Ambulatory Visit: Payer: PRIVATE HEALTH INSURANCE

## 2023-10-29 DIAGNOSIS — R1900 Intra-abdominal and pelvic swelling, mass and lump, unspecified site: Secondary | ICD-10-CM | POA: Diagnosis not present

## 2023-10-30 ENCOUNTER — Telehealth: Payer: Self-pay | Admitting: Obstetrics and Gynecology

## 2023-10-30 ENCOUNTER — Encounter: Payer: Self-pay | Admitting: Obstetrics and Gynecology

## 2023-10-30 NOTE — Telephone Encounter (Signed)
 LM with GYN results. Neg except possible pelvic congestion syndrome. Pt with pelvic fullness but could also be MSK. Will discuss when pt calls back.

## 2023-11-02 NOTE — Telephone Encounter (Signed)
 Call Tues

## 2023-11-04 NOTE — Telephone Encounter (Signed)
 Spoke with pt re:GYN u/s results after discussing with Dr. Valentino Saxon. She recommended pelvic PT first. Pt did pelvic PT many many yrs ago; very sedentary with work/free time due to foot issues. Not stretching. Recommended pelvic/hip/low back stretches, as well as water aerobics or bike for exercise. Can start pelvic floor exercises and will refer to pelvic PT. If sx persist, can do MRI for pelvic congestion vs ref to [facility].

## 2023-11-05 ENCOUNTER — Encounter: Payer: Self-pay | Admitting: Podiatry

## 2023-11-05 ENCOUNTER — Ambulatory Visit: Payer: PRIVATE HEALTH INSURANCE | Admitting: Podiatry

## 2023-11-05 DIAGNOSIS — Q666 Other congenital valgus deformities of feet: Secondary | ICD-10-CM | POA: Diagnosis not present

## 2023-11-05 DIAGNOSIS — M7750 Other enthesopathy of unspecified foot: Secondary | ICD-10-CM

## 2023-11-05 DIAGNOSIS — M76822 Posterior tibial tendinitis, left leg: Secondary | ICD-10-CM

## 2023-11-05 DIAGNOSIS — S93691D Other sprain of right foot, subsequent encounter: Secondary | ICD-10-CM

## 2023-11-05 DIAGNOSIS — M76821 Posterior tibial tendinitis, right leg: Secondary | ICD-10-CM | POA: Diagnosis not present

## 2023-11-05 DIAGNOSIS — M66871 Spontaneous rupture of other tendons, right ankle and foot: Secondary | ICD-10-CM | POA: Diagnosis not present

## 2023-11-05 NOTE — Progress Notes (Signed)
 She presents today after having not seen her for a couple of years with a chief concern of my issues being left stable I feel that my orthotics are not working as well.  Objective: Vital signs are stable alert oriented x 3.  She has tenderness of the posterior tibial tendon on physical exam she has pes planovalgus.  She also demonstrates some mild plantar fascial type symptoms.  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  Plan: She will follow-up with [facility] for new orthotics.

## 2023-11-18 ENCOUNTER — Encounter: Payer: Self-pay | Admitting: Obstetrics and Gynecology

## 2023-11-19 ENCOUNTER — Other Ambulatory Visit: Payer: Self-pay | Admitting: Obstetrics and Gynecology

## 2023-11-19 DIAGNOSIS — R1900 Intra-abdominal and pelvic swelling, mass and lump, unspecified site: Secondary | ICD-10-CM

## 2023-11-19 NOTE — Telephone Encounter (Signed)
 Please let pt know order placed. Can take a few weeks to be contacted.

## 2023-12-30 ENCOUNTER — Other Ambulatory Visit: Payer: PRIVATE HEALTH INSURANCE

## 2024-04-23 ENCOUNTER — Ambulatory Visit: Payer: PRIVATE HEALTH INSURANCE
